# Patient Record
Sex: Male | Born: 1945
Health system: Southern US, Community
[De-identification: ages and names within clinical notes are randomized; demographics above are authoritative.]

## PROBLEM LIST (undated history)

## (undated) DIAGNOSIS — M109 Gout, unspecified: Secondary | ICD-10-CM

## (undated) DIAGNOSIS — J45909 Unspecified asthma, uncomplicated: Secondary | ICD-10-CM

## (undated) DIAGNOSIS — E785 Hyperlipidemia, unspecified: Secondary | ICD-10-CM

## (undated) DIAGNOSIS — K219 Gastro-esophageal reflux disease without esophagitis: Secondary | ICD-10-CM

## (undated) DIAGNOSIS — E119 Type 2 diabetes mellitus without complications: Secondary | ICD-10-CM

## (undated) DIAGNOSIS — I10 Essential (primary) hypertension: Secondary | ICD-10-CM

## (undated) HISTORY — DX: Unspecified asthma, uncomplicated: J45.909

## (undated) HISTORY — DX: Gastro-esophageal reflux disease without esophagitis: K21.9

## (undated) HISTORY — DX: Hyperlipidemia, unspecified: E78.5

## (undated) HISTORY — DX: Essential (primary) hypertension: I10

## (undated) HISTORY — DX: Type 2 diabetes mellitus without complications: E11.9

## (undated) HISTORY — DX: Gout, unspecified: M10.9

---

## 2005-04-03 ENCOUNTER — Ambulatory Visit: Payer: Self-pay | Admitting: Internal Medicine

## 2012-12-12 ENCOUNTER — Emergency Department: Payer: Self-pay | Admitting: Emergency Medicine

## 2015-07-11 DIAGNOSIS — Z794 Long term (current) use of insulin: Secondary | ICD-10-CM | POA: Diagnosis not present

## 2015-07-11 DIAGNOSIS — E782 Mixed hyperlipidemia: Secondary | ICD-10-CM | POA: Diagnosis not present

## 2015-07-11 DIAGNOSIS — I10 Essential (primary) hypertension: Secondary | ICD-10-CM | POA: Diagnosis not present

## 2015-07-11 DIAGNOSIS — E119 Type 2 diabetes mellitus without complications: Secondary | ICD-10-CM | POA: Diagnosis not present

## 2015-07-18 DIAGNOSIS — I10 Essential (primary) hypertension: Secondary | ICD-10-CM | POA: Diagnosis not present

## 2015-07-18 DIAGNOSIS — E782 Mixed hyperlipidemia: Secondary | ICD-10-CM | POA: Diagnosis not present

## 2015-07-18 DIAGNOSIS — Z794 Long term (current) use of insulin: Secondary | ICD-10-CM | POA: Diagnosis not present

## 2015-07-18 DIAGNOSIS — J45909 Unspecified asthma, uncomplicated: Secondary | ICD-10-CM | POA: Diagnosis not present

## 2015-07-18 DIAGNOSIS — E119 Type 2 diabetes mellitus without complications: Secondary | ICD-10-CM | POA: Diagnosis not present

## 2015-07-18 DIAGNOSIS — K219 Gastro-esophageal reflux disease without esophagitis: Secondary | ICD-10-CM | POA: Diagnosis not present

## 2015-10-01 DIAGNOSIS — J45909 Unspecified asthma, uncomplicated: Secondary | ICD-10-CM | POA: Diagnosis not present

## 2015-10-01 DIAGNOSIS — J310 Chronic rhinitis: Secondary | ICD-10-CM | POA: Diagnosis not present

## 2015-10-01 DIAGNOSIS — R0602 Shortness of breath: Secondary | ICD-10-CM | POA: Diagnosis not present

## 2015-10-01 DIAGNOSIS — K219 Gastro-esophageal reflux disease without esophagitis: Secondary | ICD-10-CM | POA: Diagnosis not present

## 2015-10-15 DIAGNOSIS — E119 Type 2 diabetes mellitus without complications: Secondary | ICD-10-CM | POA: Diagnosis not present

## 2015-10-15 DIAGNOSIS — Z794 Long term (current) use of insulin: Secondary | ICD-10-CM | POA: Diagnosis not present

## 2015-10-15 DIAGNOSIS — E782 Mixed hyperlipidemia: Secondary | ICD-10-CM | POA: Diagnosis not present

## 2015-10-15 DIAGNOSIS — J45909 Unspecified asthma, uncomplicated: Secondary | ICD-10-CM | POA: Diagnosis not present

## 2015-10-15 DIAGNOSIS — I10 Essential (primary) hypertension: Secondary | ICD-10-CM | POA: Diagnosis not present

## 2015-10-22 DIAGNOSIS — E119 Type 2 diabetes mellitus without complications: Secondary | ICD-10-CM | POA: Diagnosis not present

## 2015-10-22 DIAGNOSIS — Z794 Long term (current) use of insulin: Secondary | ICD-10-CM | POA: Diagnosis not present

## 2015-10-22 DIAGNOSIS — I10 Essential (primary) hypertension: Secondary | ICD-10-CM | POA: Diagnosis not present

## 2015-10-22 DIAGNOSIS — J45909 Unspecified asthma, uncomplicated: Secondary | ICD-10-CM | POA: Diagnosis not present

## 2015-10-22 DIAGNOSIS — E782 Mixed hyperlipidemia: Secondary | ICD-10-CM | POA: Diagnosis not present

## 2015-10-24 DIAGNOSIS — J45909 Unspecified asthma, uncomplicated: Secondary | ICD-10-CM | POA: Diagnosis not present

## 2015-10-24 DIAGNOSIS — J310 Chronic rhinitis: Secondary | ICD-10-CM | POA: Diagnosis not present

## 2015-10-24 DIAGNOSIS — K219 Gastro-esophageal reflux disease without esophagitis: Secondary | ICD-10-CM | POA: Diagnosis not present

## 2015-11-21 DIAGNOSIS — J309 Allergic rhinitis, unspecified: Secondary | ICD-10-CM | POA: Diagnosis not present

## 2015-11-21 DIAGNOSIS — R05 Cough: Secondary | ICD-10-CM | POA: Diagnosis not present

## 2015-11-21 DIAGNOSIS — J45909 Unspecified asthma, uncomplicated: Secondary | ICD-10-CM | POA: Diagnosis not present

## 2016-02-20 DIAGNOSIS — R05 Cough: Secondary | ICD-10-CM | POA: Diagnosis not present

## 2016-02-20 DIAGNOSIS — J4520 Mild intermittent asthma, uncomplicated: Secondary | ICD-10-CM | POA: Diagnosis not present

## 2016-02-20 DIAGNOSIS — Z23 Encounter for immunization: Secondary | ICD-10-CM | POA: Diagnosis not present

## 2016-04-13 DIAGNOSIS — E119 Type 2 diabetes mellitus without complications: Secondary | ICD-10-CM | POA: Diagnosis not present

## 2016-04-15 DIAGNOSIS — Z0000 Encounter for general adult medical examination without abnormal findings: Secondary | ICD-10-CM | POA: Diagnosis not present

## 2016-04-15 DIAGNOSIS — E782 Mixed hyperlipidemia: Secondary | ICD-10-CM | POA: Diagnosis not present

## 2016-04-15 DIAGNOSIS — Z125 Encounter for screening for malignant neoplasm of prostate: Secondary | ICD-10-CM | POA: Diagnosis not present

## 2016-04-15 DIAGNOSIS — I10 Essential (primary) hypertension: Secondary | ICD-10-CM | POA: Diagnosis not present

## 2016-04-15 DIAGNOSIS — E119 Type 2 diabetes mellitus without complications: Secondary | ICD-10-CM | POA: Diagnosis not present

## 2016-04-15 DIAGNOSIS — Z794 Long term (current) use of insulin: Secondary | ICD-10-CM | POA: Diagnosis not present

## 2016-04-27 DIAGNOSIS — E782 Mixed hyperlipidemia: Secondary | ICD-10-CM | POA: Diagnosis not present

## 2016-04-27 DIAGNOSIS — Z0000 Encounter for general adult medical examination without abnormal findings: Secondary | ICD-10-CM | POA: Diagnosis not present

## 2016-04-27 DIAGNOSIS — Z23 Encounter for immunization: Secondary | ICD-10-CM | POA: Diagnosis not present

## 2016-04-27 DIAGNOSIS — R972 Elevated prostate specific antigen [PSA]: Secondary | ICD-10-CM | POA: Diagnosis not present

## 2016-04-27 DIAGNOSIS — E119 Type 2 diabetes mellitus without complications: Secondary | ICD-10-CM | POA: Diagnosis not present

## 2016-04-27 DIAGNOSIS — Z794 Long term (current) use of insulin: Secondary | ICD-10-CM | POA: Diagnosis not present

## 2016-04-27 DIAGNOSIS — I10 Essential (primary) hypertension: Secondary | ICD-10-CM | POA: Diagnosis not present

## 2016-06-29 DIAGNOSIS — Z0000 Encounter for general adult medical examination without abnormal findings: Secondary | ICD-10-CM | POA: Diagnosis not present

## 2016-08-20 DIAGNOSIS — J4530 Mild persistent asthma, uncomplicated: Secondary | ICD-10-CM | POA: Diagnosis not present

## 2016-10-20 DIAGNOSIS — Z125 Encounter for screening for malignant neoplasm of prostate: Secondary | ICD-10-CM | POA: Diagnosis not present

## 2016-10-20 DIAGNOSIS — E119 Type 2 diabetes mellitus without complications: Secondary | ICD-10-CM | POA: Diagnosis not present

## 2016-10-20 DIAGNOSIS — E782 Mixed hyperlipidemia: Secondary | ICD-10-CM | POA: Diagnosis not present

## 2016-10-20 DIAGNOSIS — I10 Essential (primary) hypertension: Secondary | ICD-10-CM | POA: Diagnosis not present

## 2016-10-20 DIAGNOSIS — Z794 Long term (current) use of insulin: Secondary | ICD-10-CM | POA: Diagnosis not present

## 2016-10-27 DIAGNOSIS — Z794 Long term (current) use of insulin: Secondary | ICD-10-CM | POA: Diagnosis not present

## 2016-10-27 DIAGNOSIS — I10 Essential (primary) hypertension: Secondary | ICD-10-CM | POA: Diagnosis not present

## 2016-10-27 DIAGNOSIS — E119 Type 2 diabetes mellitus without complications: Secondary | ICD-10-CM | POA: Diagnosis not present

## 2016-10-27 DIAGNOSIS — E782 Mixed hyperlipidemia: Secondary | ICD-10-CM | POA: Diagnosis not present

## 2016-10-27 DIAGNOSIS — R972 Elevated prostate specific antigen [PSA]: Secondary | ICD-10-CM | POA: Diagnosis not present

## 2017-02-18 DIAGNOSIS — R490 Dysphonia: Secondary | ICD-10-CM | POA: Diagnosis not present

## 2017-02-18 DIAGNOSIS — Z23 Encounter for immunization: Secondary | ICD-10-CM | POA: Diagnosis not present

## 2017-02-18 DIAGNOSIS — J310 Chronic rhinitis: Secondary | ICD-10-CM | POA: Diagnosis not present

## 2017-02-18 DIAGNOSIS — J4530 Mild persistent asthma, uncomplicated: Secondary | ICD-10-CM | POA: Diagnosis not present

## 2017-04-14 DIAGNOSIS — E113293 Type 2 diabetes mellitus with mild nonproliferative diabetic retinopathy without macular edema, bilateral: Secondary | ICD-10-CM | POA: Diagnosis not present

## 2017-04-21 DIAGNOSIS — E782 Mixed hyperlipidemia: Secondary | ICD-10-CM | POA: Diagnosis not present

## 2017-04-21 DIAGNOSIS — Z794 Long term (current) use of insulin: Secondary | ICD-10-CM | POA: Diagnosis not present

## 2017-04-21 DIAGNOSIS — I10 Essential (primary) hypertension: Secondary | ICD-10-CM | POA: Diagnosis not present

## 2017-04-21 DIAGNOSIS — Z125 Encounter for screening for malignant neoplasm of prostate: Secondary | ICD-10-CM | POA: Diagnosis not present

## 2017-04-21 DIAGNOSIS — E119 Type 2 diabetes mellitus without complications: Secondary | ICD-10-CM | POA: Diagnosis not present

## 2017-04-28 DIAGNOSIS — Z0000 Encounter for general adult medical examination without abnormal findings: Secondary | ICD-10-CM | POA: Diagnosis not present

## 2017-04-28 DIAGNOSIS — I10 Essential (primary) hypertension: Secondary | ICD-10-CM | POA: Diagnosis not present

## 2017-04-28 DIAGNOSIS — Z794 Long term (current) use of insulin: Secondary | ICD-10-CM | POA: Diagnosis not present

## 2017-04-28 DIAGNOSIS — Z23 Encounter for immunization: Secondary | ICD-10-CM | POA: Diagnosis not present

## 2017-04-28 DIAGNOSIS — R972 Elevated prostate specific antigen [PSA]: Secondary | ICD-10-CM | POA: Diagnosis not present

## 2017-04-28 DIAGNOSIS — E782 Mixed hyperlipidemia: Secondary | ICD-10-CM | POA: Diagnosis not present

## 2017-04-28 DIAGNOSIS — E119 Type 2 diabetes mellitus without complications: Secondary | ICD-10-CM | POA: Diagnosis not present

## 2017-04-28 DIAGNOSIS — E010 Iodine-deficiency related diffuse (endemic) goiter: Secondary | ICD-10-CM | POA: Diagnosis not present

## 2017-04-28 DIAGNOSIS — J3489 Other specified disorders of nose and nasal sinuses: Secondary | ICD-10-CM | POA: Diagnosis not present

## 2017-05-06 DIAGNOSIS — R05 Cough: Secondary | ICD-10-CM | POA: Diagnosis not present

## 2017-05-06 DIAGNOSIS — J340 Abscess, furuncle and carbuncle of nose: Secondary | ICD-10-CM | POA: Diagnosis not present

## 2017-05-06 DIAGNOSIS — R490 Dysphonia: Secondary | ICD-10-CM | POA: Diagnosis not present

## 2017-05-27 ENCOUNTER — Ambulatory Visit: Payer: Self-pay | Admitting: Urology

## 2017-06-09 ENCOUNTER — Encounter: Payer: Self-pay | Admitting: Urology

## 2017-06-09 ENCOUNTER — Ambulatory Visit: Payer: PPO | Admitting: Urology

## 2017-06-09 VITALS — BP 122/68 | HR 62 | Ht 69.0 in | Wt 184.5 lb

## 2017-06-09 DIAGNOSIS — R972 Elevated prostate specific antigen [PSA]: Secondary | ICD-10-CM

## 2017-06-09 LAB — URINALYSIS, COMPLETE
BILIRUBIN UA: NEGATIVE
GLUCOSE, UA: NEGATIVE
Ketones, UA: NEGATIVE
Leukocytes, UA: NEGATIVE
Nitrite, UA: NEGATIVE
PH UA: 6 (ref 5.0–7.5)
Protein, UA: NEGATIVE
RBC UA: NEGATIVE
SPEC GRAV UA: 1.015 (ref 1.005–1.030)
UUROB: 0.2 mg/dL (ref 0.2–1.0)

## 2017-06-09 NOTE — Progress Notes (Signed)
06/09/2017 2:48 PM   Alexander Davidson 13-Mar-1946 [id number]  Referring provider: Dion Body, MD [facility] [place], [place] [address]  Chief Complaint  Patient presents with  . Elevated PSA    HPI: Alexander Davidson is a 72 year old male referred for evaluation of an elevated PSA.  A PSA on 04/21/2017 was elevated at 7.34. His most recent PSA are as follows:  10/2013    4.46 02/2014  3.74 11/17      5.07 10/2016    5.89 04/2017  7.34  He has mild to moderate lower urinary tract symptoms including decreased force and caliber of his urinary stream, intermittent urinary stream and urinary frequency.  His voiding symptoms are not bothersome.  He denies dysuria or gross hematuria.  He denies flank, abdominal, pelvic or scrotal pain.  There is no family history of prostate cancer.   PMH: Past Medical History:  Diagnosis Date  . Asthma   . Diabetes mellitus without complication ([place])   . GERD (gastroesophageal reflux disease)   . Gout   . Hyperlipidemia   . Hypertension     Surgical History: History reviewed. No pertinent surgical history.  Home Medications:  Allergies as of 06/09/2017      Reactions   Other Swelling   Questionable fish allergy- caused throat swelling      Medication List        Accurate as of 06/09/17  2:48 PM. Always use your most recent med list.          INSULIN SYRINGE 1CC/31GX5/16" 31G X 5/16" 1 ML Misc Use 1 Syringe 2 (two) times daily.   losartan-hydrochlorothiazide 100-25 MG tablet Commonly known as:  HYZAAR   metFORMIN 1000 MG tablet Commonly known as:  GLUCOPHAGE Take by mouth.   MULTI-VITAMINS Tabs Take by mouth.   NOVOLIN N 100 UNIT/ML injection Generic drug:  insulin NPH Human Inject into the skin.   omeprazole 10 MG capsule Commonly known as:  PRILOSEC Take by mouth.   ONE TOUCH ULTRA TEST test strip Generic drug:  glucose blood Use 1 each as directed. Check CBG's twice daily and PRN  if symptomatic. Dx: 010.27   ONETOUCH DELICA LANCETS FINE Misc Use 1 Units as directed. Check CBG's twice daily and PRN if symptomatic. Dx: 250.02   simvastatin 20 MG tablet Commonly known as:  ZOCOR Take by mouth.   SYMBICORT 160-4.5 MCG/ACT inhaler Generic drug:  budesonide-formoterol Inhale into the lungs.   URINOZINC PLUS PO Take by mouth.   OSTEO BI-FLEX/5-LOXIN ADVANCED PO Take by mouth.       Allergies:  Allergies  Allergen Reactions  . Other Swelling    Questionable fish allergy- caused throat swelling    Family History: Family History  Problem Relation Age of Onset  . Prostate cancer Neg Hx   . Bladder Cancer Neg Hx   . Kidney cancer Neg Hx     Social History:  reports that  has never smoked. he has never used smokeless tobacco. He reports that he drinks alcohol. He reports that he does not use drugs.  ROS: UROLOGY Frequent Urination?: Yes Hard to postpone urination?: No Burning/pain with urination?: No Get up at night to urinate?: Yes Leakage of urine?: No Urine stream starts and stops?: Yes Trouble starting stream?: No Do you have to strain to urinate?: No Blood in urine?: No Urinary tract infection?: No Sexually transmitted disease?: No Injury to kidneys or bladder?: No Painful intercourse?: No Weak stream?: Yes  Erection problems?: Yes Penile pain?: No  Gastrointestinal Nausea?: No Vomiting?: No Indigestion/heartburn?: No Diarrhea?: No Constipation?: No  Constitutional Fever: No Night sweats?: No Weight loss?: No Fatigue?: No  Skin Skin rash/lesions?: No Itching?: Yes  Eyes Blurred vision?: No Double vision?: No  Ears/Nose/Throat Sore throat?: No Sinus problems?: Yes  Hematologic/Lymphatic Swollen glands?: No Easy bruising?: No  Cardiovascular Leg swelling?: No Chest pain?: No  Respiratory Cough?: Yes Shortness of breath?: No  Endocrine Excessive thirst?: No  Musculoskeletal Back pain?: Yes Joint pain?:  Yes  Neurological Headaches?: No Dizziness?: No  Psychologic Depression?: No Anxiety?: No  Physical Exam: BP 122/68 (BP Location: Right Arm, Patient Position: Sitting, Cuff Size: Normal)   Pulse 62   Ht 5\' 9"  (1.753 m)   Wt 184 lb 8 oz (83.7 kg)   BMI 27.25 kg/m   Constitutional:  Alert and oriented, No acute distress. HEENT: [place] AT, moist mucus membranes.  Trachea midline, no masses. Cardiovascular: No clubbing, cyanosis, or edema. Respiratory: Normal respiratory effort, no increased work of breathing. GI: Abdomen is soft, nontender, nondistended, no abdominal masses GU: No CVA tenderness.  Prostate 40 g with increased firmness left side.  Penis circumcised without lesions, testes descended bilaterally without masses or tenderness. Skin: No rashes, bruises or suspicious lesions. Lymph: No cervical or inguinal adenopathy. Neurologic: Grossly intact, no focal deficits, moving all 4 extremities. Psychiatric: Normal mood and affect.  Laboratory Data:  Urinalysis Dipstick/microscopy negative  Assessment & Plan:    72 year old male with a rising PSA and DRE.  These findings were discussed with Alexander Davidson and his wife.  Urinalysis shows no evidence of infection.  I recommended scheduling  transrectal ultrasound and biopsy of the prostate.  Discussed surveillance however this was not recommended.  Potential complications of prostate biopsy were discussed including bleeding and infection/sepsis.  He wanted to think this over before scheduling.   [name], [facility] [address] [place], [place] [address] [phone]

## 2017-06-10 ENCOUNTER — Encounter: Payer: Self-pay | Admitting: Urology

## 2017-06-10 DIAGNOSIS — R972 Elevated prostate specific antigen [PSA]: Secondary | ICD-10-CM | POA: Insufficient documentation

## 2017-08-19 DIAGNOSIS — J4520 Mild intermittent asthma, uncomplicated: Secondary | ICD-10-CM | POA: Diagnosis not present

## 2017-08-19 DIAGNOSIS — J310 Chronic rhinitis: Secondary | ICD-10-CM | POA: Diagnosis not present

## 2017-08-19 DIAGNOSIS — R0602 Shortness of breath: Secondary | ICD-10-CM | POA: Diagnosis not present

## 2017-08-24 DIAGNOSIS — I10 Essential (primary) hypertension: Secondary | ICD-10-CM | POA: Diagnosis not present

## 2017-08-24 DIAGNOSIS — Z794 Long term (current) use of insulin: Secondary | ICD-10-CM | POA: Diagnosis not present

## 2017-08-24 DIAGNOSIS — E782 Mixed hyperlipidemia: Secondary | ICD-10-CM | POA: Diagnosis not present

## 2017-08-24 DIAGNOSIS — E119 Type 2 diabetes mellitus without complications: Secondary | ICD-10-CM | POA: Diagnosis not present

## 2017-08-26 ENCOUNTER — Telehealth: Payer: Self-pay | Admitting: Urology

## 2017-08-26 DIAGNOSIS — R972 Elevated prostate specific antigen [PSA]: Secondary | ICD-10-CM

## 2017-08-26 NOTE — Telephone Encounter (Signed)
Pt called and wants to know if he can come in to have PSA lab drawn.  If still rising, he will consider a prostate biopsy.  Please advise.

## 2017-08-27 DIAGNOSIS — R972 Elevated prostate specific antigen [PSA]: Secondary | ICD-10-CM | POA: Diagnosis not present

## 2017-08-29 NOTE — Telephone Encounter (Signed)
Okay to have PSA drawn.  Order was entered

## 2017-08-31 DIAGNOSIS — I10 Essential (primary) hypertension: Secondary | ICD-10-CM | POA: Diagnosis not present

## 2017-08-31 DIAGNOSIS — Z794 Long term (current) use of insulin: Secondary | ICD-10-CM | POA: Diagnosis not present

## 2017-08-31 DIAGNOSIS — Z862 Personal history of diseases of the blood and blood-forming organs and certain disorders involving the immune mechanism: Secondary | ICD-10-CM | POA: Diagnosis not present

## 2017-08-31 DIAGNOSIS — E119 Type 2 diabetes mellitus without complications: Secondary | ICD-10-CM | POA: Diagnosis not present

## 2017-08-31 DIAGNOSIS — R972 Elevated prostate specific antigen [PSA]: Secondary | ICD-10-CM | POA: Diagnosis not present

## 2017-08-31 DIAGNOSIS — E782 Mixed hyperlipidemia: Secondary | ICD-10-CM | POA: Diagnosis not present

## 2017-09-16 NOTE — Telephone Encounter (Signed)
Pt had PSA drawn by Dr. Carlean Jews @ [facility].  It was 6.4, so it has decreased.  Should pt still consider prostate biopsy?  Please advise.

## 2017-09-17 NOTE — Telephone Encounter (Signed)
PSA is still elevated above baseline and DRE was abnormal.  Would still recc prostate biopsy

## 2017-09-20 NOTE — Telephone Encounter (Signed)
lmom for pt to call office

## 2017-09-21 NOTE — Telephone Encounter (Signed)
lmom for pt to call office

## 2017-09-22 NOTE — Telephone Encounter (Signed)
Pt wife informed, pt has not made up mind whether he wants to proceed with biopsy will call with questions or to schedule.

## 2017-12-29 DIAGNOSIS — I10 Essential (primary) hypertension: Secondary | ICD-10-CM | POA: Diagnosis not present

## 2017-12-29 DIAGNOSIS — R972 Elevated prostate specific antigen [PSA]: Secondary | ICD-10-CM | POA: Diagnosis not present

## 2017-12-29 DIAGNOSIS — Z862 Personal history of diseases of the blood and blood-forming organs and certain disorders involving the immune mechanism: Secondary | ICD-10-CM | POA: Diagnosis not present

## 2017-12-29 DIAGNOSIS — E782 Mixed hyperlipidemia: Secondary | ICD-10-CM | POA: Diagnosis not present

## 2017-12-29 DIAGNOSIS — Z794 Long term (current) use of insulin: Secondary | ICD-10-CM | POA: Diagnosis not present

## 2017-12-29 DIAGNOSIS — E119 Type 2 diabetes mellitus without complications: Secondary | ICD-10-CM | POA: Diagnosis not present

## 2018-01-10 DIAGNOSIS — I10 Essential (primary) hypertension: Secondary | ICD-10-CM | POA: Diagnosis not present

## 2018-01-10 DIAGNOSIS — L989 Disorder of the skin and subcutaneous tissue, unspecified: Secondary | ICD-10-CM | POA: Diagnosis not present

## 2018-01-10 DIAGNOSIS — Z794 Long term (current) use of insulin: Secondary | ICD-10-CM | POA: Diagnosis not present

## 2018-01-10 DIAGNOSIS — R972 Elevated prostate specific antigen [PSA]: Secondary | ICD-10-CM | POA: Diagnosis not present

## 2018-01-10 DIAGNOSIS — E782 Mixed hyperlipidemia: Secondary | ICD-10-CM | POA: Diagnosis not present

## 2018-01-10 DIAGNOSIS — E119 Type 2 diabetes mellitus without complications: Secondary | ICD-10-CM | POA: Diagnosis not present

## 2018-01-10 DIAGNOSIS — Z862 Personal history of diseases of the blood and blood-forming organs and certain disorders involving the immune mechanism: Secondary | ICD-10-CM | POA: Diagnosis not present

## 2018-02-17 DIAGNOSIS — J4520 Mild intermittent asthma, uncomplicated: Secondary | ICD-10-CM | POA: Diagnosis not present

## 2018-06-09 DIAGNOSIS — E113291 Type 2 diabetes mellitus with mild nonproliferative diabetic retinopathy without macular edema, right eye: Secondary | ICD-10-CM | POA: Diagnosis not present

## 2018-06-17 DIAGNOSIS — Z1211 Encounter for screening for malignant neoplasm of colon: Secondary | ICD-10-CM | POA: Diagnosis not present

## 2018-07-06 DIAGNOSIS — Z862 Personal history of diseases of the blood and blood-forming organs and certain disorders involving the immune mechanism: Secondary | ICD-10-CM | POA: Diagnosis not present

## 2018-07-06 DIAGNOSIS — E782 Mixed hyperlipidemia: Secondary | ICD-10-CM | POA: Diagnosis not present

## 2018-07-06 DIAGNOSIS — R972 Elevated prostate specific antigen [PSA]: Secondary | ICD-10-CM | POA: Diagnosis not present

## 2018-07-06 DIAGNOSIS — Z794 Long term (current) use of insulin: Secondary | ICD-10-CM | POA: Diagnosis not present

## 2018-07-06 DIAGNOSIS — I10 Essential (primary) hypertension: Secondary | ICD-10-CM | POA: Diagnosis not present

## 2018-07-06 DIAGNOSIS — E119 Type 2 diabetes mellitus without complications: Secondary | ICD-10-CM | POA: Diagnosis not present

## 2018-07-13 DIAGNOSIS — Z23 Encounter for immunization: Secondary | ICD-10-CM | POA: Diagnosis not present

## 2018-07-13 DIAGNOSIS — E782 Mixed hyperlipidemia: Secondary | ICD-10-CM | POA: Diagnosis not present

## 2018-07-13 DIAGNOSIS — Z0000 Encounter for general adult medical examination without abnormal findings: Secondary | ICD-10-CM | POA: Diagnosis not present

## 2018-07-13 DIAGNOSIS — R972 Elevated prostate specific antigen [PSA]: Secondary | ICD-10-CM | POA: Diagnosis not present

## 2018-07-13 DIAGNOSIS — E119 Type 2 diabetes mellitus without complications: Secondary | ICD-10-CM | POA: Diagnosis not present

## 2018-07-13 DIAGNOSIS — I10 Essential (primary) hypertension: Secondary | ICD-10-CM | POA: Diagnosis not present

## 2018-07-13 DIAGNOSIS — Z794 Long term (current) use of insulin: Secondary | ICD-10-CM | POA: Diagnosis not present

## 2018-07-13 DIAGNOSIS — Z862 Personal history of diseases of the blood and blood-forming organs and certain disorders involving the immune mechanism: Secondary | ICD-10-CM | POA: Diagnosis not present

## 2018-07-19 DIAGNOSIS — J310 Chronic rhinitis: Secondary | ICD-10-CM | POA: Diagnosis not present

## 2018-07-19 DIAGNOSIS — J4520 Mild intermittent asthma, uncomplicated: Secondary | ICD-10-CM | POA: Diagnosis not present

## 2018-08-12 DIAGNOSIS — J189 Pneumonia, unspecified organism: Secondary | ICD-10-CM | POA: Diagnosis not present

## 2018-10-26 DIAGNOSIS — E1169 Type 2 diabetes mellitus with other specified complication: Secondary | ICD-10-CM | POA: Diagnosis not present

## 2018-10-26 DIAGNOSIS — Z862 Personal history of diseases of the blood and blood-forming organs and certain disorders involving the immune mechanism: Secondary | ICD-10-CM | POA: Diagnosis not present

## 2018-10-26 DIAGNOSIS — E782 Mixed hyperlipidemia: Secondary | ICD-10-CM | POA: Diagnosis not present

## 2018-10-26 DIAGNOSIS — I10 Essential (primary) hypertension: Secondary | ICD-10-CM | POA: Diagnosis not present

## 2018-10-26 DIAGNOSIS — E785 Hyperlipidemia, unspecified: Secondary | ICD-10-CM | POA: Diagnosis not present

## 2018-10-26 DIAGNOSIS — R972 Elevated prostate specific antigen [PSA]: Secondary | ICD-10-CM | POA: Diagnosis not present

## 2018-11-02 DIAGNOSIS — I10 Essential (primary) hypertension: Secondary | ICD-10-CM | POA: Diagnosis not present

## 2018-11-02 DIAGNOSIS — E785 Hyperlipidemia, unspecified: Secondary | ICD-10-CM | POA: Diagnosis not present

## 2018-11-02 DIAGNOSIS — Z862 Personal history of diseases of the blood and blood-forming organs and certain disorders involving the immune mechanism: Secondary | ICD-10-CM | POA: Diagnosis not present

## 2018-11-02 DIAGNOSIS — E1169 Type 2 diabetes mellitus with other specified complication: Secondary | ICD-10-CM | POA: Diagnosis not present

## 2018-11-02 DIAGNOSIS — E782 Mixed hyperlipidemia: Secondary | ICD-10-CM | POA: Diagnosis not present

## 2018-11-02 DIAGNOSIS — R972 Elevated prostate specific antigen [PSA]: Secondary | ICD-10-CM | POA: Diagnosis not present

## 2019-01-17 DIAGNOSIS — J4520 Mild intermittent asthma, uncomplicated: Secondary | ICD-10-CM | POA: Diagnosis not present

## 2019-01-17 DIAGNOSIS — J310 Chronic rhinitis: Secondary | ICD-10-CM | POA: Diagnosis not present

## 2019-02-28 DIAGNOSIS — I10 Essential (primary) hypertension: Secondary | ICD-10-CM | POA: Diagnosis not present

## 2019-02-28 DIAGNOSIS — E782 Mixed hyperlipidemia: Secondary | ICD-10-CM | POA: Diagnosis not present

## 2019-02-28 DIAGNOSIS — R972 Elevated prostate specific antigen [PSA]: Secondary | ICD-10-CM | POA: Diagnosis not present

## 2019-02-28 DIAGNOSIS — Z862 Personal history of diseases of the blood and blood-forming organs and certain disorders involving the immune mechanism: Secondary | ICD-10-CM | POA: Diagnosis not present

## 2019-02-28 DIAGNOSIS — E785 Hyperlipidemia, unspecified: Secondary | ICD-10-CM | POA: Diagnosis not present

## 2019-02-28 DIAGNOSIS — E1169 Type 2 diabetes mellitus with other specified complication: Secondary | ICD-10-CM | POA: Diagnosis not present

## 2019-03-07 DIAGNOSIS — E785 Hyperlipidemia, unspecified: Secondary | ICD-10-CM | POA: Diagnosis not present

## 2019-03-07 DIAGNOSIS — E782 Mixed hyperlipidemia: Secondary | ICD-10-CM | POA: Diagnosis not present

## 2019-03-07 DIAGNOSIS — Z862 Personal history of diseases of the blood and blood-forming organs and certain disorders involving the immune mechanism: Secondary | ICD-10-CM | POA: Diagnosis not present

## 2019-03-07 DIAGNOSIS — I10 Essential (primary) hypertension: Secondary | ICD-10-CM | POA: Diagnosis not present

## 2019-03-07 DIAGNOSIS — R972 Elevated prostate specific antigen [PSA]: Secondary | ICD-10-CM | POA: Diagnosis not present

## 2019-03-07 DIAGNOSIS — E1169 Type 2 diabetes mellitus with other specified complication: Secondary | ICD-10-CM | POA: Diagnosis not present

## 2019-08-16 DIAGNOSIS — H2513 Age-related nuclear cataract, bilateral: Secondary | ICD-10-CM | POA: Diagnosis not present

## 2019-08-21 DIAGNOSIS — R05 Cough: Secondary | ICD-10-CM | POA: Diagnosis not present

## 2019-08-21 DIAGNOSIS — Z01818 Encounter for other preprocedural examination: Secondary | ICD-10-CM | POA: Diagnosis not present

## 2019-08-21 DIAGNOSIS — J4520 Mild intermittent asthma, uncomplicated: Secondary | ICD-10-CM | POA: Diagnosis not present

## 2019-08-21 DIAGNOSIS — J310 Chronic rhinitis: Secondary | ICD-10-CM | POA: Diagnosis not present

## 2019-08-29 DIAGNOSIS — E1169 Type 2 diabetes mellitus with other specified complication: Secondary | ICD-10-CM | POA: Diagnosis not present

## 2019-08-29 DIAGNOSIS — Z862 Personal history of diseases of the blood and blood-forming organs and certain disorders involving the immune mechanism: Secondary | ICD-10-CM | POA: Diagnosis not present

## 2019-08-29 DIAGNOSIS — E785 Hyperlipidemia, unspecified: Secondary | ICD-10-CM | POA: Diagnosis not present

## 2019-08-29 DIAGNOSIS — I10 Essential (primary) hypertension: Secondary | ICD-10-CM | POA: Diagnosis not present

## 2019-08-29 DIAGNOSIS — E782 Mixed hyperlipidemia: Secondary | ICD-10-CM | POA: Diagnosis not present

## 2019-08-29 DIAGNOSIS — R972 Elevated prostate specific antigen [PSA]: Secondary | ICD-10-CM | POA: Diagnosis not present

## 2019-09-05 DIAGNOSIS — E782 Mixed hyperlipidemia: Secondary | ICD-10-CM | POA: Diagnosis not present

## 2019-09-05 DIAGNOSIS — E785 Hyperlipidemia, unspecified: Secondary | ICD-10-CM | POA: Diagnosis not present

## 2019-09-05 DIAGNOSIS — Z0000 Encounter for general adult medical examination without abnormal findings: Secondary | ICD-10-CM | POA: Diagnosis not present

## 2019-09-05 DIAGNOSIS — E1169 Type 2 diabetes mellitus with other specified complication: Secondary | ICD-10-CM | POA: Diagnosis not present

## 2019-09-05 DIAGNOSIS — I10 Essential (primary) hypertension: Secondary | ICD-10-CM | POA: Diagnosis not present

## 2019-09-05 DIAGNOSIS — N289 Disorder of kidney and ureter, unspecified: Secondary | ICD-10-CM | POA: Diagnosis not present

## 2019-09-05 DIAGNOSIS — Z862 Personal history of diseases of the blood and blood-forming organs and certain disorders involving the immune mechanism: Secondary | ICD-10-CM | POA: Diagnosis not present

## 2019-09-05 DIAGNOSIS — R972 Elevated prostate specific antigen [PSA]: Secondary | ICD-10-CM | POA: Diagnosis not present

## 2019-12-26 DIAGNOSIS — Z862 Personal history of diseases of the blood and blood-forming organs and certain disorders involving the immune mechanism: Secondary | ICD-10-CM | POA: Diagnosis not present

## 2019-12-26 DIAGNOSIS — E1169 Type 2 diabetes mellitus with other specified complication: Secondary | ICD-10-CM | POA: Diagnosis not present

## 2019-12-26 DIAGNOSIS — R972 Elevated prostate specific antigen [PSA]: Secondary | ICD-10-CM | POA: Diagnosis not present

## 2019-12-26 DIAGNOSIS — N289 Disorder of kidney and ureter, unspecified: Secondary | ICD-10-CM | POA: Diagnosis not present

## 2019-12-26 DIAGNOSIS — E782 Mixed hyperlipidemia: Secondary | ICD-10-CM | POA: Diagnosis not present

## 2019-12-26 DIAGNOSIS — E785 Hyperlipidemia, unspecified: Secondary | ICD-10-CM | POA: Diagnosis not present

## 2020-01-02 DIAGNOSIS — E1169 Type 2 diabetes mellitus with other specified complication: Secondary | ICD-10-CM | POA: Diagnosis not present

## 2020-01-02 DIAGNOSIS — E782 Mixed hyperlipidemia: Secondary | ICD-10-CM | POA: Diagnosis not present

## 2020-01-02 DIAGNOSIS — E785 Hyperlipidemia, unspecified: Secondary | ICD-10-CM | POA: Diagnosis not present

## 2020-01-02 DIAGNOSIS — Z862 Personal history of diseases of the blood and blood-forming organs and certain disorders involving the immune mechanism: Secondary | ICD-10-CM | POA: Diagnosis not present

## 2020-01-02 DIAGNOSIS — Z1159 Encounter for screening for other viral diseases: Secondary | ICD-10-CM | POA: Diagnosis not present

## 2020-01-02 DIAGNOSIS — N289 Disorder of kidney and ureter, unspecified: Secondary | ICD-10-CM | POA: Diagnosis not present

## 2020-01-02 DIAGNOSIS — R972 Elevated prostate specific antigen [PSA]: Secondary | ICD-10-CM | POA: Diagnosis not present

## 2020-02-20 DIAGNOSIS — J310 Chronic rhinitis: Secondary | ICD-10-CM | POA: Diagnosis not present

## 2020-02-20 DIAGNOSIS — J4520 Mild intermittent asthma, uncomplicated: Secondary | ICD-10-CM | POA: Diagnosis not present

## 2020-04-26 DIAGNOSIS — E785 Hyperlipidemia, unspecified: Secondary | ICD-10-CM | POA: Diagnosis not present

## 2020-04-26 DIAGNOSIS — E1169 Type 2 diabetes mellitus with other specified complication: Secondary | ICD-10-CM | POA: Diagnosis not present

## 2020-04-26 DIAGNOSIS — Z1159 Encounter for screening for other viral diseases: Secondary | ICD-10-CM | POA: Diagnosis not present

## 2020-04-26 DIAGNOSIS — N289 Disorder of kidney and ureter, unspecified: Secondary | ICD-10-CM | POA: Diagnosis not present

## 2020-04-26 DIAGNOSIS — E782 Mixed hyperlipidemia: Secondary | ICD-10-CM | POA: Diagnosis not present

## 2020-04-26 DIAGNOSIS — R972 Elevated prostate specific antigen [PSA]: Secondary | ICD-10-CM | POA: Diagnosis not present

## 2020-05-03 DIAGNOSIS — E782 Mixed hyperlipidemia: Secondary | ICD-10-CM | POA: Diagnosis not present

## 2020-05-03 DIAGNOSIS — E1169 Type 2 diabetes mellitus with other specified complication: Secondary | ICD-10-CM | POA: Diagnosis not present

## 2020-05-03 DIAGNOSIS — E785 Hyperlipidemia, unspecified: Secondary | ICD-10-CM | POA: Diagnosis not present

## 2020-05-03 DIAGNOSIS — N289 Disorder of kidney and ureter, unspecified: Secondary | ICD-10-CM | POA: Diagnosis not present

## 2020-05-03 DIAGNOSIS — R972 Elevated prostate specific antigen [PSA]: Secondary | ICD-10-CM | POA: Diagnosis not present

## 2020-05-03 DIAGNOSIS — Z9189 Other specified personal risk factors, not elsewhere classified: Secondary | ICD-10-CM | POA: Diagnosis not present

## 2020-05-27 ENCOUNTER — Encounter: Payer: Self-pay | Admitting: Urology

## 2020-05-27 ENCOUNTER — Other Ambulatory Visit: Payer: Self-pay

## 2020-05-27 ENCOUNTER — Ambulatory Visit: Payer: PPO | Admitting: Urology

## 2020-05-27 VITALS — BP 164/75 | HR 66 | Ht 66.0 in | Wt 184.1 lb

## 2020-05-27 DIAGNOSIS — R3989 Other symptoms and signs involving the genitourinary system: Secondary | ICD-10-CM | POA: Diagnosis not present

## 2020-05-27 DIAGNOSIS — R972 Elevated prostate specific antigen [PSA]: Secondary | ICD-10-CM

## 2020-05-27 NOTE — Progress Notes (Signed)
05/27/2020 2:11 PM   Alexander Davidson Oct 21, 1945 [id number]  Referring provider: Marisue Ivan, MD [phone] [address] [facility] [address] [facility] [place],  [place] [address]  Chief Complaint  Patient presents with  . Elevated PSA    HPI: 75 y.o. male presents for follow-up of an elevated PSA.   Initially seen January 2019 for a PSA 7.34 that had slowly increased since 2015 from 4.46  Abnormal DRE with firm left prostate  Prostate biopsy was recommended and he requested to have his PSA repeated which was 6.4  Biopsy was still recommended due to the likelihood of prostate cancer based on his PSA elevation and abnormal DRE and he ultimately decided not to pursue  Recent PSA by his PCP last month was 9.27 and follow-up urology visit was recommended  Stable lower urinary tract symptoms including frequency, urgency and decreased stream  Does complain of low back pain  Denies gross hematuria   PMH: Past Medical History:  Diagnosis Date  . Asthma   . Diabetes mellitus without complication ([facility])   . GERD (gastroesophageal reflux disease)   . Gout   . Hyperlipidemia   . Hypertension     Surgical History: No past surgical history on file.  Home Medications:  Allergies as of 05/27/2020      Reactions   Other Swelling   Questionable fish allergy- caused throat swelling      Medication List       Accurate as of May 27, 2020  2:11 PM. If you have any questions, ask your nurse or doctor.        budesonide-formoterol 160-4.5 MCG/ACT inhaler Commonly known as: SYMBICORT Inhale into the lungs.   diphenhydrAMINE 25 mg capsule Commonly known as: BENADRYL Take by mouth.   gentamicin ointment 0.1 % Commonly known as: GARAMYCIN Apply topically.   glucose blood test strip Use 1 each as directed. Check CBG's twice daily and PRN if symptomatic. Dx: 250.02   hydrochlorothiazide 25 MG tablet Commonly known as: HYDRODIURIL Take 25 mg by mouth daily.   insulin  NPH Human 100 UNIT/ML injection Commonly known as: NOVOLIN N Inject into the skin.   INSULIN SYRINGE 1CC/31GX5/16" 31G X 5/16" 1 ML Misc Use 1 Syringe 2 (two) times daily.   losartan 100 MG tablet Commonly known as: COZAAR Take 100 mg by mouth daily.   losartan-hydrochlorothiazide 100-25 MG tablet Commonly known as: HYZAAR   metFORMIN 1000 MG tablet Commonly known as: GLUCOPHAGE Take by mouth.   Multi-Vitamins Tabs Take by mouth.   omeprazole 10 MG capsule Commonly known as: PRILOSEC Take by mouth.   omeprazole 20 MG tablet Commonly known as: PRILOSEC OTC 0.5 tablets daily.   OneTouch Delica Lancets Fine Misc Use 1 Units as directed. Check CBG's twice daily and PRN if symptomatic. Dx: 250.02   Saw Palmetto 450 MG Caps Take 2 capsules by mouth daily.   simvastatin 20 MG tablet Commonly known as: ZOCOR Take by mouth.   URINOZINC PLUS PO Take by mouth.   OSTEO BI-FLEX/5-LOXIN ADVANCED PO Take by mouth.       Allergies:  Allergies  Allergen Reactions  . Other Swelling    Questionable fish allergy- caused throat swelling    Family History: Family History  Problem Relation Age of Onset  . Prostate cancer Neg Hx   . Bladder Cancer Neg Hx   . Kidney cancer Neg Hx     Social History:  reports that he has never smoked. He has never used smokeless  tobacco. He reports current alcohol use. He reports that he does not use drugs.   Physical Exam: BP (!) 164/75 (BP Location: Left Arm, Patient Position: Sitting, Cuff Size: Large)   Pulse 66   Ht 5\' 6"  (1.676 m)   Wt 184 lb 1.6 oz (83.5 kg)   BMI 29.71 kg/m   Constitutional:  Alert and oriented, No acute distress. HEENT: [place] AT, moist mucus membranes.  Trachea midline, no masses. Cardiovascular: No clubbing, cyanosis, or edema. Respiratory: Normal respiratory effort, no increased work of breathing. GI: Abdomen is soft, nontender, nondistended, no abdominal masses GU: Prostate 45 g with bilateral firmness L  >R Neurologic: Grossly intact, no focal deficits, moving all 4 extremities. Psychiatric: Normal mood and affect.   Assessment & Plan:    75 y.o. male with a rising PSA and abnormal DRE.  Findings were discussed in detail and that they are very suspicious for prostate cancer.  He wanted to know why he could not be treated for prostate cancer without having a biopsy and we discussed that treatment is very on grade of cancer and staging  I went over the prostate biopsy in detail and offered to schedule an same-day surgery under sedation if he was concerned about the discomfort  He indicated he wanted to think this over and will call back with his decision  I spent 30 total minutes on the day of the encounter including pre-visit review of the medical record, face-to-face time with the patient, and post visit ordering of labs/imaging/tests.   Riki Altes, MD  [facility] [place] [facility] [address], [address] [place], [place] [address] [phone]

## 2020-05-28 ENCOUNTER — Encounter: Payer: Self-pay | Admitting: Urology

## 2020-05-28 DIAGNOSIS — R3989 Other symptoms and signs involving the genitourinary system: Secondary | ICD-10-CM | POA: Insufficient documentation

## 2020-07-15 DIAGNOSIS — M545 Low back pain, unspecified: Secondary | ICD-10-CM | POA: Diagnosis not present

## 2020-07-15 DIAGNOSIS — R300 Dysuria: Secondary | ICD-10-CM | POA: Diagnosis not present

## 2020-07-15 DIAGNOSIS — R351 Nocturia: Secondary | ICD-10-CM | POA: Diagnosis not present

## 2020-07-15 DIAGNOSIS — N3090 Cystitis, unspecified without hematuria: Secondary | ICD-10-CM | POA: Diagnosis not present

## 2020-07-21 ENCOUNTER — Telehealth: Payer: Self-pay | Admitting: Urology

## 2020-07-21 NOTE — Telephone Encounter (Signed)
At office visit January 2022 prostate biopsy was recommended due to elevated PSA and abnormal prostate exam.  Indicated he wanted to think this over.  Please see if he has any questions

## 2020-07-22 DIAGNOSIS — M545 Low back pain, unspecified: Secondary | ICD-10-CM | POA: Diagnosis not present

## 2020-07-22 DIAGNOSIS — R0600 Dyspnea, unspecified: Secondary | ICD-10-CM | POA: Diagnosis not present

## 2020-07-22 DIAGNOSIS — G8929 Other chronic pain: Secondary | ICD-10-CM | POA: Diagnosis not present

## 2020-07-22 DIAGNOSIS — R972 Elevated prostate specific antigen [PSA]: Secondary | ICD-10-CM | POA: Diagnosis not present

## 2020-07-22 DIAGNOSIS — J4520 Mild intermittent asthma, uncomplicated: Secondary | ICD-10-CM | POA: Diagnosis not present

## 2020-07-25 ENCOUNTER — Encounter: Payer: Self-pay | Admitting: *Deleted

## 2020-07-25 NOTE — Telephone Encounter (Signed)
Left message on sent my chart message

## 2020-08-19 DIAGNOSIS — E119 Type 2 diabetes mellitus without complications: Secondary | ICD-10-CM | POA: Diagnosis not present

## 2020-09-11 DIAGNOSIS — E785 Hyperlipidemia, unspecified: Secondary | ICD-10-CM | POA: Diagnosis not present

## 2020-09-11 DIAGNOSIS — E1169 Type 2 diabetes mellitus with other specified complication: Secondary | ICD-10-CM | POA: Diagnosis not present

## 2020-09-11 DIAGNOSIS — E782 Mixed hyperlipidemia: Secondary | ICD-10-CM | POA: Diagnosis not present

## 2020-09-18 DIAGNOSIS — E785 Hyperlipidemia, unspecified: Secondary | ICD-10-CM | POA: Diagnosis not present

## 2020-09-18 DIAGNOSIS — E782 Mixed hyperlipidemia: Secondary | ICD-10-CM | POA: Diagnosis not present

## 2020-09-18 DIAGNOSIS — I10 Essential (primary) hypertension: Secondary | ICD-10-CM | POA: Diagnosis not present

## 2020-09-18 DIAGNOSIS — Z862 Personal history of diseases of the blood and blood-forming organs and certain disorders involving the immune mechanism: Secondary | ICD-10-CM | POA: Diagnosis not present

## 2020-09-18 DIAGNOSIS — E1169 Type 2 diabetes mellitus with other specified complication: Secondary | ICD-10-CM | POA: Diagnosis not present

## 2020-09-18 DIAGNOSIS — Z0000 Encounter for general adult medical examination without abnormal findings: Secondary | ICD-10-CM | POA: Diagnosis not present

## 2020-09-18 DIAGNOSIS — N289 Disorder of kidney and ureter, unspecified: Secondary | ICD-10-CM | POA: Diagnosis not present

## 2021-01-16 DIAGNOSIS — J310 Chronic rhinitis: Secondary | ICD-10-CM | POA: Diagnosis not present

## 2021-01-16 DIAGNOSIS — J4520 Mild intermittent asthma, uncomplicated: Secondary | ICD-10-CM | POA: Diagnosis not present

## 2021-03-14 DIAGNOSIS — E785 Hyperlipidemia, unspecified: Secondary | ICD-10-CM | POA: Diagnosis not present

## 2021-03-14 DIAGNOSIS — N289 Disorder of kidney and ureter, unspecified: Secondary | ICD-10-CM | POA: Diagnosis not present

## 2021-03-14 DIAGNOSIS — Z862 Personal history of diseases of the blood and blood-forming organs and certain disorders involving the immune mechanism: Secondary | ICD-10-CM | POA: Diagnosis not present

## 2021-03-14 DIAGNOSIS — Z125 Encounter for screening for malignant neoplasm of prostate: Secondary | ICD-10-CM | POA: Diagnosis not present

## 2021-03-14 DIAGNOSIS — E782 Mixed hyperlipidemia: Secondary | ICD-10-CM | POA: Diagnosis not present

## 2021-03-14 DIAGNOSIS — E1169 Type 2 diabetes mellitus with other specified complication: Secondary | ICD-10-CM | POA: Diagnosis not present

## 2021-03-14 DIAGNOSIS — I10 Essential (primary) hypertension: Secondary | ICD-10-CM | POA: Diagnosis not present

## 2021-03-21 DIAGNOSIS — E785 Hyperlipidemia, unspecified: Secondary | ICD-10-CM | POA: Diagnosis not present

## 2021-03-21 DIAGNOSIS — N289 Disorder of kidney and ureter, unspecified: Secondary | ICD-10-CM | POA: Diagnosis not present

## 2021-03-21 DIAGNOSIS — Z862 Personal history of diseases of the blood and blood-forming organs and certain disorders involving the immune mechanism: Secondary | ICD-10-CM | POA: Diagnosis not present

## 2021-03-21 DIAGNOSIS — I10 Essential (primary) hypertension: Secondary | ICD-10-CM | POA: Diagnosis not present

## 2021-03-21 DIAGNOSIS — N1831 Chronic kidney disease, stage 3a: Secondary | ICD-10-CM | POA: Diagnosis not present

## 2021-03-21 DIAGNOSIS — E782 Mixed hyperlipidemia: Secondary | ICD-10-CM | POA: Diagnosis not present

## 2021-03-21 DIAGNOSIS — E1169 Type 2 diabetes mellitus with other specified complication: Secondary | ICD-10-CM | POA: Diagnosis not present

## 2021-07-16 DIAGNOSIS — E782 Mixed hyperlipidemia: Secondary | ICD-10-CM | POA: Diagnosis not present

## 2021-07-16 DIAGNOSIS — Z862 Personal history of diseases of the blood and blood-forming organs and certain disorders involving the immune mechanism: Secondary | ICD-10-CM | POA: Diagnosis not present

## 2021-07-16 DIAGNOSIS — E785 Hyperlipidemia, unspecified: Secondary | ICD-10-CM | POA: Diagnosis not present

## 2021-07-16 DIAGNOSIS — I10 Essential (primary) hypertension: Secondary | ICD-10-CM | POA: Diagnosis not present

## 2021-07-16 DIAGNOSIS — E1169 Type 2 diabetes mellitus with other specified complication: Secondary | ICD-10-CM | POA: Diagnosis not present

## 2021-07-23 DIAGNOSIS — N289 Disorder of kidney and ureter, unspecified: Secondary | ICD-10-CM | POA: Diagnosis not present

## 2021-07-23 DIAGNOSIS — Z862 Personal history of diseases of the blood and blood-forming organs and certain disorders involving the immune mechanism: Secondary | ICD-10-CM | POA: Diagnosis not present

## 2021-07-23 DIAGNOSIS — E785 Hyperlipidemia, unspecified: Secondary | ICD-10-CM | POA: Diagnosis not present

## 2021-07-23 DIAGNOSIS — E1169 Type 2 diabetes mellitus with other specified complication: Secondary | ICD-10-CM | POA: Diagnosis not present

## 2021-07-23 DIAGNOSIS — R972 Elevated prostate specific antigen [PSA]: Secondary | ICD-10-CM | POA: Diagnosis not present

## 2021-07-23 DIAGNOSIS — N1831 Chronic kidney disease, stage 3a: Secondary | ICD-10-CM | POA: Diagnosis not present

## 2021-07-23 DIAGNOSIS — E782 Mixed hyperlipidemia: Secondary | ICD-10-CM | POA: Diagnosis not present

## 2021-07-23 DIAGNOSIS — I10 Essential (primary) hypertension: Secondary | ICD-10-CM | POA: Diagnosis not present

## 2021-08-04 ENCOUNTER — Encounter: Payer: Self-pay | Admitting: *Deleted

## 2021-08-19 DIAGNOSIS — E119 Type 2 diabetes mellitus without complications: Secondary | ICD-10-CM | POA: Diagnosis not present

## 2021-09-01 ENCOUNTER — Ambulatory Visit: Payer: PPO | Admitting: Urology

## 2021-09-01 ENCOUNTER — Encounter: Payer: Self-pay | Admitting: Urology

## 2021-09-01 VITALS — BP 173/80 | HR 63 | Ht 66.0 in | Wt 180.0 lb

## 2021-09-01 DIAGNOSIS — R972 Elevated prostate specific antigen [PSA]: Secondary | ICD-10-CM

## 2021-09-01 DIAGNOSIS — R3989 Other symptoms and signs involving the genitourinary system: Secondary | ICD-10-CM

## 2021-09-01 NOTE — Progress Notes (Signed)
? ?09/01/2021 ?1:09 PM  ? ?Alexander Davidson ?09-Nov-1945 ?[id number] ? ?Referring provider: Dion Body, MD ?[place] ?[facility] ?[place],  [place] [address] ? ?Chief Complaint  ?Patient presents with  ? Elevated PSA  ? ? ?HPI: ?76 y.o. male for follow-up of an elevated PSA.  He was accompanied by his wife. ? ?Initially seen January 2019 and prostate biopsy recommended secondary to rising PSA and abnormal DRE ?He has declined schedule biopsy ?Last seen January 2022 and declined biopsy ?PSA October 2022 was 9.40 and 10.21 July 2021 ?He presents today to discuss prostate biopsy ? ? ?PMH: ?Past Medical History:  ?Diagnosis Date  ? Asthma   ? Diabetes mellitus without complication ([place])   ? GERD (gastroesophageal reflux disease)   ? Gout   ? Hyperlipidemia   ? Hypertension   ? ? ?Surgical History: ?History reviewed. No pertinent surgical history. ? ?Home Medications:  ?Allergies as of 09/01/2021   ? ?   Reactions  ? Other Swelling  ? Questionable fish allergy- caused throat swelling  ? ?  ? ?  ?Medication List  ?  ? ?  ? Accurate as of September 01, 2021  1:09 PM. If you have any questions, ask your nurse or doctor.  ?  ?  ? ?  ? ?STOP taking these medications   ? ?diphenhydrAMINE 25 mg capsule ?Commonly known as: BENADRYL ?Stopped by: [name], MD ?  ? ?  ? ?TAKE these medications   ? ?budesonide-formoterol 160-4.5 MCG/ACT inhaler ?Commonly known as: SYMBICORT ?Inhale into the lungs. ?  ?gentamicin ointment 0.1 % ?Commonly known as: GARAMYCIN ?Apply topically. ?  ?glucose blood test strip ?Use 1 each as directed. Check CBG's twice daily and PRN if symptomatic. Dx: 250.02 ?  ?hydrochlorothiazide 25 MG tablet ?Commonly known as: HYDRODIURIL ?Take 25 mg by mouth daily. ?  ?insulin NPH Human 100 UNIT/ML injection ?Commonly known as: NOVOLIN N ?Inject into the skin. ?  ?INSULIN SYRINGE 1CC/31GX5/16" 31G X 5/16" 1 ML Misc ?Use 1 Syringe 2 (two) times daily. ?  ?losartan 100 MG tablet ?Commonly known  as: COZAAR ?Take 100 mg by mouth daily. ?  ?losartan-hydrochlorothiazide 100-25 MG tablet ?Commonly known as: HYZAAR ?  ?metFORMIN 1000 MG tablet ?Commonly known as: GLUCOPHAGE ?Take by mouth. ?  ?Multi-Vitamins Tabs ?Take by mouth. ?  ?omeprazole 10 MG capsule ?Commonly known as: PRILOSEC ?Take by mouth. ?  ?omeprazole 20 MG tablet ?Commonly known as: PRILOSEC OTC ?0.5 tablets daily. ?  ?OneTouch Delica Lancets Fine Misc ?Use 1 Units as directed. Check CBG's twice daily and PRN if symptomatic. Dx: 250.02 ?  ?Saw Palmetto 450 MG Caps ?Take 2 capsules by mouth daily. ?  ?simvastatin 20 MG tablet ?Commonly known as: ZOCOR ?Take by mouth. ?  ?URINOZINC PLUS PO ?Take by mouth. ?  ?OSTEO BI-FLEX/5-LOXIN ADVANCED PO ?Take by mouth. ?  ? ?  ? ? ?Allergies:  ?Allergies  ?Allergen Reactions  ? Other Swelling  ?  Questionable fish allergy- caused throat swelling  ? ? ?Family History: ?Family History  ?Problem Relation Age of Onset  ? Prostate cancer Neg Hx   ? Bladder Cancer Neg Hx   ? Kidney cancer Neg Hx   ? ? ?Social History:  reports that he has never smoked. He has never used smokeless tobacco. He reports current alcohol use. He reports that he does not use drugs. ? ? ?Physical Exam: ?BP (!) 173/80   Pulse 63   Ht '5\' 6"'$  (  1.676 m)   Wt 180 lb (81.6 kg)   BMI 29.05 kg/m?   ?Constitutional:  Alert and oriented, No acute distress. ?HEENT: [place] AT, moist mucus membranes.  Trachea midline, no masses. ?Cardiovascular: No clubbing, cyanosis, or edema. ?Respiratory: Normal respiratory effort, no increased work of breathing. ?Psychiatric: Normal mood and affect. ? ? ?Assessment & Plan:   ? ?1.  Elevated PSA ?With his rising PSA he is ready to schedule a biopsy ?He inquired if there were any other ways to obtain prostate biopsy samples other than going through the rectum. ?We did discuss transperineal prostate biopsy however we are not equipped to perform and he would need referral.  He was informed [facility] is the closest facility  doing transperineal biopsies ?He was trying to decide if he wanted to have the biopsy performed in same-day surgery under sedation or in the office.  The pros and cons of each procedure were discussed ?He was undecided at this point and we will have the office contact him next week regarding his decision. ?Biopsy complications were discussed including bleeding, infection/sepsis ? ?I spent 30 total minutes on the day of the encounter including pre-visit review of the medical record, face-to-face time with the patient, and post visit ordering of labs/imaging/tests. ? ? ?[name], MD ? ?[facility] ?[address], [address] ?[place], [place] [address] ?[phone] ? ?

## 2021-09-09 ENCOUNTER — Telehealth: Payer: Self-pay | Admitting: Urology

## 2021-09-09 NOTE — Telephone Encounter (Signed)
Patient is ready to schedule his biopsy.  ?

## 2021-09-09 NOTE — Telephone Encounter (Signed)
Left message on voice mail. Not sure which way he wants to go with a biopsy . (Read office note per stoioff.)  ?

## 2021-09-29 ENCOUNTER — Ambulatory Visit (INDEPENDENT_AMBULATORY_CARE_PROVIDER_SITE_OTHER): Payer: PPO | Admitting: Urology

## 2021-09-29 ENCOUNTER — Encounter: Payer: Self-pay | Admitting: Urology

## 2021-09-29 VITALS — BP 150/63 | HR 63 | Ht 68.0 in | Wt 175.0 lb

## 2021-09-29 DIAGNOSIS — R972 Elevated prostate specific antigen [PSA]: Secondary | ICD-10-CM

## 2021-09-29 DIAGNOSIS — C61 Malignant neoplasm of prostate: Secondary | ICD-10-CM

## 2021-09-29 MED ORDER — GENTAMICIN SULFATE 40 MG/ML IJ SOLN
80.0000 mg | Freq: Once | INTRAMUSCULAR | Status: AC
  Administered 2021-09-29: 80 mg via INTRAMUSCULAR

## 2021-09-29 MED ORDER — LEVOFLOXACIN 500 MG PO TABS
500.0000 mg | ORAL_TABLET | Freq: Once | ORAL | Status: AC
  Administered 2021-09-29: 500 mg via ORAL

## 2021-09-29 NOTE — Progress Notes (Signed)
Prostate Biopsy Procedure   Informed consent was obtained after discussing risks/benefits of the procedure.  A time out was performed to ensure correct patient identity.  Pre-Procedure: - Last PSA Level: 03/14/2021-9.4; abnormal DRE - Gentamicin given prophylactically - Levaquin 500 mg administered PO -Transrectal Ultrasound performed revealing a 51 gm prostate -No significant hypoechoic or median lobe noted  Procedure: - Prostate block performed using 10 cc 1% lidocaine and biopsies taken from sextant areas, a total of 12 under ultrasound guidance.  Post-Procedure: - Patient tolerated the procedure well - He was counseled to seek immediate medical attention if experiences any severe pain, significant bleeding, or fevers - Return in one week to discuss biopsy results   John Giovanni, MD

## 2021-10-01 LAB — SURGICAL PATHOLOGY

## 2021-10-03 ENCOUNTER — Telehealth: Payer: Self-pay | Admitting: Urology

## 2021-10-03 DIAGNOSIS — C61 Malignant neoplasm of prostate: Secondary | ICD-10-CM

## 2021-10-03 NOTE — Telephone Encounter (Signed)
I contacted Alexander Davidson to discuss his prostate biopsy report.  He had no postbiopsy problems or complaints.  6/6 left cores were positive for adenocarcinoma (5 cores Gleason 4+5 and 1 Gleason 5+4) tissue involvement ranged from 56-100%.  4/6 right cores also showed adenocarcinoma (3 Gleason 4+5 and 1 Gleason 4+4) tissue involvement on that side range from 10-53%  The pathology report was reviewed and we discussed he has high risk disease.  Staging evaluation recommended and order was placed for CT and bone scan.  Will call with results and discuss treatment options based on staging evaluation.  All of his questions were answered and he was instructed to call should he have any additional questions.

## 2021-10-10 ENCOUNTER — Ambulatory Visit: Payer: PPO | Admitting: Urology

## 2021-10-15 ENCOUNTER — Ambulatory Visit
Admission: RE | Admit: 2021-10-15 | Discharge: 2021-10-15 | Disposition: A | Discharge status: Home / Self Care | Payer: PPO | Source: Ambulatory Visit | Attending: Urology | Admitting: Urology

## 2021-10-15 DIAGNOSIS — C61 Malignant neoplasm of prostate: Secondary | ICD-10-CM | POA: Insufficient documentation

## 2021-10-15 DIAGNOSIS — K8020 Calculus of gallbladder without cholecystitis without obstruction: Secondary | ICD-10-CM | POA: Diagnosis not present

## 2021-10-15 DIAGNOSIS — K5730 Diverticulosis of large intestine without perforation or abscess without bleeding: Secondary | ICD-10-CM | POA: Diagnosis not present

## 2021-10-15 LAB — POCT I-STAT CREATININE: Creatinine, Ser: 1.4 mg/dL — ABNORMAL HIGH (ref 0.61–1.24)

## 2021-10-15 MED ORDER — IOHEXOL 300 MG/ML  SOLN
80.0000 mL | Freq: Once | INTRAMUSCULAR | Status: AC | PRN
  Administered 2021-10-15: 80 mL via INTRAVENOUS

## 2021-10-17 ENCOUNTER — Telehealth: Payer: Self-pay | Admitting: *Deleted

## 2021-10-17 NOTE — Telephone Encounter (Signed)
Notified patient as instructed, patient pleased °

## 2021-10-17 NOTE — Telephone Encounter (Signed)
-----   Message from [name], MD sent at 10/17/2021  7:54 AM EDT ----- CT scan showed no evidence of metastatic disease.  Bone scan is scheduled next week and will contact with results once the report is received.

## 2021-10-22 ENCOUNTER — Ambulatory Visit
Admission: RE | Admit: 2021-10-22 | Discharge: 2021-10-22 | Disposition: A | Discharge status: Home / Self Care | Payer: PPO | Source: Ambulatory Visit | Attending: Urology | Admitting: Urology

## 2021-10-22 ENCOUNTER — Encounter
Admission: RE | Admit: 2021-10-22 | Discharge: 2021-10-22 | Disposition: A | Discharge status: Home / Self Care | Payer: PPO | Source: Ambulatory Visit | Attending: Urology | Admitting: Urology

## 2021-10-22 DIAGNOSIS — C61 Malignant neoplasm of prostate: Secondary | ICD-10-CM | POA: Insufficient documentation

## 2021-10-22 DIAGNOSIS — M47812 Spondylosis without myelopathy or radiculopathy, cervical region: Secondary | ICD-10-CM | POA: Diagnosis not present

## 2021-10-22 MED ORDER — TECHNETIUM TC 99M MEDRONATE IV KIT
20.0000 | PACK | Freq: Once | INTRAVENOUS | Status: AC | PRN
  Administered 2021-10-22: 20.42 via INTRAVENOUS

## 2021-10-24 ENCOUNTER — Encounter: Payer: Self-pay | Admitting: Urology

## 2021-10-24 ENCOUNTER — Telehealth: Payer: Self-pay

## 2021-10-24 NOTE — Telephone Encounter (Signed)
Notified via [place].  Recommended radiation oncology referral

## 2021-10-24 NOTE — Telephone Encounter (Signed)
Pt LM on triage line requesting results of recent scan, please advise.

## 2021-10-29 ENCOUNTER — Encounter: Payer: Self-pay | Admitting: Urology

## 2021-10-29 ENCOUNTER — Ambulatory Visit (INDEPENDENT_AMBULATORY_CARE_PROVIDER_SITE_OTHER): Payer: PPO | Admitting: Urology

## 2021-10-29 VITALS — BP 122/64 | HR 65 | Ht 68.0 in | Wt 180.0 lb

## 2021-10-29 DIAGNOSIS — C61 Malignant neoplasm of prostate: Secondary | ICD-10-CM

## 2021-10-30 ENCOUNTER — Encounter: Payer: Self-pay | Admitting: Urology

## 2021-10-30 NOTE — Progress Notes (Signed)
10/29/2021 7:48 AM   Alexander Davidson 08/12/1945 [id number]  Referring provider: Dion Body, MD [facility] [place],  [place] [address]  Chief Complaint  Patient presents with   Prostate Cancer    Urologic history: 1.  Prostate cancer Bx 09/2021, PSA 10.6, firm left prostate Volume 51 g 6/6 left cores positive Gleason 4+5/5+4 (56-100% tissue involvement) 4/6 right cores Gleason 4+5/4+4 (10-53%) No evidence of metastatic disease CT abdomen/pelvis and bone scan   HPI: 76 y.o. male with the above history.  I had recommended radiation oncology referral and he made a follow-up appointment.  He had questions prior to making the referral.  I reviewed his staging evaluation and that there is no evidence of metastatic disease on CT/bone scan He had several questions regarding radiation and I recommend he asked these to radiation oncology We discussed prostatectomy and based on age and comorbidities I felt the risk of surgery would outweigh benefits   PMH: Past Medical History:  Diagnosis Date   Asthma    Diabetes mellitus without complication ([place])    GERD (gastroesophageal reflux disease)    Gout    Hyperlipidemia    Hypertension     Surgical History: History reviewed. No pertinent surgical history.  Home Medications:  Allergies as of 10/29/2021       Reactions   Other Swelling   Questionable fish allergy- caused throat swelling        Medication List        Accurate as of October 29, 2021 11:59 PM. If you have any questions, ask your nurse or doctor.          budesonide-formoterol 160-4.5 MCG/ACT inhaler Commonly known as: SYMBICORT Inhale into the lungs.   gentamicin ointment 0.1 % Commonly known as: GARAMYCIN Apply topically.   glucose blood test strip Use 1 each as directed. Check CBG's twice daily and PRN if symptomatic. Dx: 250.02   hydrochlorothiazide 25 MG tablet Commonly known as: HYDRODIURIL Take 25 mg by  mouth daily.   insulin NPH Human 100 UNIT/ML injection Commonly known as: NOVOLIN N Inject into the skin.   INSULIN SYRINGE 1CC/31GX5/16" 31G X 5/16" 1 ML Misc Use 1 Syringe 2 (two) times daily.   losartan 100 MG tablet Commonly known as: COZAAR Take 100 mg by mouth daily.   losartan-hydrochlorothiazide 100-25 MG tablet Commonly known as: HYZAAR   metFORMIN 1000 MG tablet Commonly known as: GLUCOPHAGE Take by mouth.   Multi-Vitamins Tabs Take by mouth.   omeprazole 10 MG capsule Commonly known as: PRILOSEC Take by mouth.   omeprazole 20 MG tablet Commonly known as: PRILOSEC OTC 0.5 tablets daily.   OneTouch Delica Lancets Fine Misc Use 1 Units as directed. Check CBG's twice daily and PRN if symptomatic. Dx: 250.02   Saw Palmetto 450 MG Caps Take 2 capsules by mouth daily.   simvastatin 20 MG tablet Commonly known as: ZOCOR Take by mouth.   URINOZINC PLUS PO Take by mouth.   OSTEO BI-FLEX/5-LOXIN ADVANCED PO Take by mouth.        Allergies:  Allergies  Allergen Reactions   Other Swelling    Questionable fish allergy- caused throat swelling    Family History: Family History  Problem Relation Age of Onset   Prostate cancer Neg Hx    Bladder Cancer Neg Hx    Kidney cancer Neg Hx     Social History:  reports that he has never smoked. He has never used smokeless tobacco.  He reports current alcohol use. He reports that he does not use drugs.   Physical Exam: BP 122/64   Pulse 65   Ht '5\' 8"'$  (1.727 m)   Wt 180 lb (81.6 kg)   BMI 27.37 kg/m   Constitutional:  Alert and oriented, No acute distress. Psychiatric: Normal mood and affect.   Assessment & Plan:    Clinical T2N0M0 adenocarcinoma prostate (NCCN high risk) He would like to proceed with radiation oncology referral/evaluation Referral ordered   [name], MD  [facility] [address] [place], [place] [address] [phone]

## 2021-11-03 ENCOUNTER — Ambulatory Visit
Admission: RE | Admit: 2021-11-03 | Discharge: 2021-11-03 | Disposition: A | Discharge status: Home / Self Care | Payer: PPO | Source: Ambulatory Visit | Attending: Radiation Oncology | Admitting: Radiation Oncology

## 2021-11-03 ENCOUNTER — Encounter: Payer: Self-pay | Admitting: Radiation Oncology

## 2021-11-03 VITALS — BP 143/58 | HR 58 | Temp 97.9°F | Resp 18 | Ht 68.0 in | Wt 182.0 lb

## 2021-11-03 DIAGNOSIS — I10 Essential (primary) hypertension: Secondary | ICD-10-CM | POA: Insufficient documentation

## 2021-11-03 DIAGNOSIS — Z79899 Other long term (current) drug therapy: Secondary | ICD-10-CM | POA: Diagnosis not present

## 2021-11-03 DIAGNOSIS — Z794 Long term (current) use of insulin: Secondary | ICD-10-CM | POA: Diagnosis not present

## 2021-11-03 DIAGNOSIS — Z7984 Long term (current) use of oral hypoglycemic drugs: Secondary | ICD-10-CM | POA: Diagnosis not present

## 2021-11-03 DIAGNOSIS — E785 Hyperlipidemia, unspecified: Secondary | ICD-10-CM | POA: Insufficient documentation

## 2021-11-03 DIAGNOSIS — C61 Malignant neoplasm of prostate: Secondary | ICD-10-CM | POA: Insufficient documentation

## 2021-11-03 DIAGNOSIS — E119 Type 2 diabetes mellitus without complications: Secondary | ICD-10-CM | POA: Insufficient documentation

## 2021-11-03 DIAGNOSIS — K219 Gastro-esophageal reflux disease without esophagitis: Secondary | ICD-10-CM | POA: Insufficient documentation

## 2021-11-03 DIAGNOSIS — J45909 Unspecified asthma, uncomplicated: Secondary | ICD-10-CM | POA: Insufficient documentation

## 2021-11-03 NOTE — Consult Note (Signed)
NEW PATIENT EVALUATION  Name: Alexander Davidson  MRN: [id number]  Date:   11/03/2021     DOB: [birth year]   This 76 y.o. male patient presents to the clinic for initial evaluation of stage IIIc (cT2 cN0 M0) Gleason 9 (4+5) presenting with a PSA of 10.6.  REFERRING PHYSICIAN: Dion Body, MD  CHIEF COMPLAINT:  Chief Complaint  Patient presents with   Consult    Prostate Cancer    DIAGNOSIS: The encounter diagnosis was Malignant neoplasm of prostate ([place]).   PREVIOUS INVESTIGATIONS:  CT scan and bone scan reviewed Pathology reports reviewed Clinical notes reviewed  HPI: Patient is a 76 year old male who presented with an elevated 10.6.  Had abnormal digital rectal exam showing firmness throughout the prostate gland.  He underwent biopsy for a 51 g prostate showing 10 of 12 cores positive mostly Gleason 9 (4+5).  CT scan of his abdomen pelvis showed no acute abdominal or pelvic findings mild prostate enlargement no pelvic adenopathy no worrisome bone lesions.  Bone scan confirmed no convincing evidence of osseous metastatic disease.  Patient does have nocturia x3 no specific urgency or frequency no bowel issues.  He is having no bone pain.  He is now referred to radiation oncology for consideration of treatment.  PLANNED TREATMENT REGIMEN: Image guided IMRT radiation therapy to both prostate and pelvic nodes  PAST MEDICAL HISTORY:  has a past medical history of Asthma, Diabetes mellitus without complication ([place]), GERD (gastroesophageal reflux disease), Gout, Hyperlipidemia, and Hypertension.    PAST SURGICAL HISTORY: History reviewed. No pertinent surgical history.  FAMILY HISTORY: family history is not on file.  SOCIAL HISTORY:  reports that he has never smoked. He has never used smokeless tobacco. He reports current alcohol use. He reports that he does not use drugs.  ALLERGIES: Other  MEDICATIONS:  Current Outpatient Medications  Medication Sig Dispense Refill    budesonide-formoterol (SYMBICORT) 160-4.5 MCG/ACT inhaler Inhale into the lungs.     gentamicin ointment (GARAMYCIN) 0.1 % Apply topically.     glucose blood test strip Use 1 each as directed. Check CBG's twice daily and PRN if symptomatic. Dx: 250.02     hydrochlorothiazide (HYDRODIURIL) 25 MG tablet Take 25 mg by mouth daily.     insulin NPH Human (NOVOLIN N) 100 UNIT/ML injection Inject into the skin.     Insulin Syringe-Needle U-100 (INSULIN SYRINGE 1CC/31GX5/16") 31G X 5/16" 1 ML MISC Use 1 Syringe 2 (two) times daily.     losartan (COZAAR) 100 MG tablet Take 100 mg by mouth daily.     losartan-hydrochlorothiazide (HYZAAR) 100-25 MG tablet      metFORMIN (GLUCOPHAGE) 1000 MG tablet Take by mouth.     Misc Natural Products (OSTEO BI-FLEX/5-LOXIN ADVANCED PO) Take by mouth.     Misc Natural Products (URINOZINC PLUS PO) Take by mouth.     Multiple Vitamin (MULTI-VITAMINS) TABS Take by mouth.     omeprazole (PRILOSEC OTC) 20 MG tablet 0.5 tablets daily.     omeprazole (PRILOSEC) 10 MG capsule Take by mouth.     ONETOUCH DELICA LANCETS FINE MISC Use 1 Units as directed. Check CBG's twice daily and PRN if symptomatic. Dx: 250.02     Saw Palmetto 450 MG CAPS Take 2 capsules by mouth daily.     simvastatin (ZOCOR) 20 MG tablet Take by mouth.     No current facility-administered medications for this encounter.    ECOG PERFORMANCE STATUS:  0 - Asymptomatic  REVIEW OF SYSTEMS: Patient denies any  weight loss, fatigue, weakness, fever, chills or night sweats. Patient denies any loss of vision, blurred vision. Patient denies any ringing  of the ears or hearing loss. No irregular heartbeat. Patient denies heart murmur or history of fainting. Patient denies any chest pain or pain radiating to her upper extremities. Patient denies any shortness of breath, difficulty breathing at night, cough or hemoptysis. Patient denies any swelling in the lower legs. Patient denies any nausea vomiting, vomiting of  blood, or coffee ground material in the vomitus. Patient denies any stomach pain. Patient states has had normal bowel movements no significant constipation or diarrhea. Patient denies any dysuria, hematuria or significant nocturia. Patient denies any problems walking, swelling in the joints or loss of balance. Patient denies any skin changes, loss of hair or loss of weight. Patient denies any excessive worrying or anxiety or significant depression. Patient denies any problems with insomnia. Patient denies excessive thirst, polyuria, polydipsia. Patient denies any swollen glands, patient denies easy bruising or easy bleeding. Patient denies any recent infections, allergies or URI. Patient "s visual fields have not changed significantly in recent time.   PHYSICAL EXAM: BP (!) 143/58   Pulse (!) 58   Temp 97.9 F (36.6 C)   Resp 18   Ht '5\' 8"'$  (1.727 m)   Wt 182 lb (82.6 kg)   BMI 27.67 kg/m  Well-developed well-nourished patient in NAD. HEENT reveals PERLA, EOMI, discs not visualized.  Oral cavity is clear. No oral mucosal lesions are identified. Neck is clear without evidence of cervical or supraclavicular adenopathy. Lungs are clear to A&P. Cardiac examination is essentially unremarkable with regular rate and rhythm without murmur rub or thrill. Abdomen is benign with no organomegaly or masses noted. Motor sensory and DTR levels are equal and symmetric in the upper and lower extremities. Cranial nerves II through XII are grossly intact. Proprioception is intact. No peripheral adenopathy or edema is identified. No motor or sensory levels are noted. Crude visual fields are within normal range.  LABORATORY DATA: Pathology reports reviewed    RADIOLOGY RESULTS: Bone scan and CT scans reviewed compatible with above-stated findings   IMPRESSION: Stage IIIc adenocarcinoma of the prostate in 76 year old male Gleason 9 (4+5) presenting with a PSA in the 10 range.  PLAN: At this time I have recommended  radiation therapy to with his prostate and pelvic nodes according to the [facility] he has a 94% chance of extracapsular extension as well as a 57% chance of lymph node involvement.  I would treat his prostate 82 [address] Cables and his pelvic lymph nodes to 54 Gray using IMRT dose painting technique.  Risks and benefits of treatment including increased lower urinary tract symptoms diarrhea fatigue alteration of blood count skin reaction all were reviewed in detail with the patient and his wife.  I would also have asked urology to place fiducial markers in his prostate for daily image guided treatment.  I have also asked him to start Eligard treatment.  Patient wife both comprehend her treatment plan well.  We will schedule simulation once we have definitive time for his markers being placed.  I would like to take this opportunity to thank you for allowing me to participate in the care of your patient.Noreene Filbert, MD

## 2021-11-04 ENCOUNTER — Telehealth: Payer: Self-pay

## 2021-11-04 NOTE — Telephone Encounter (Signed)
Benefits investigation form faxed for Eligard, awaiting response.

## 2021-11-12 ENCOUNTER — Ambulatory Visit: Payer: PPO | Admitting: Urology

## 2021-11-12 ENCOUNTER — Encounter: Payer: Self-pay | Admitting: Urology

## 2021-11-12 VITALS — BP 128/65 | HR 64 | Ht 70.0 in | Wt 180.0 lb

## 2021-11-12 DIAGNOSIS — C61 Malignant neoplasm of prostate: Secondary | ICD-10-CM | POA: Diagnosis not present

## 2021-11-12 MED ORDER — LEUPROLIDE ACETATE (6 MONTH) 45 MG ~~LOC~~ KIT
45.0000 mg | PACK | Freq: Once | SUBCUTANEOUS | Status: AC
  Administered 2021-11-12: 45 mg via SUBCUTANEOUS

## 2021-11-12 MED ORDER — GENTAMICIN SULFATE 40 MG/ML IJ SOLN
80.0000 mg | Freq: Once | INTRAMUSCULAR | Status: AC
  Administered 2021-11-12: 80 mg via INTRAMUSCULAR

## 2021-11-12 MED ORDER — LEVOFLOXACIN 500 MG PO TABS
500.0000 mg | ORAL_TABLET | Freq: Once | ORAL | Status: AC
  Administered 2021-11-12: 500 mg via ORAL

## 2021-11-12 NOTE — Progress Notes (Signed)
Eligard SubQ Injection   Due to Prostate Cancer patient is present today for a Eligard Injection.  Medication: Eligard 6 month Dose: 45 mg  Location: left  Lot: [id number] Exp: 03/2023  Patient tolerated well, no complications were noted  Performed by: Elberta Leatherwood CMA  Dr. Donella Stade did not specify duration of ADT and radiation oncology will be contacted. Patient's next follow up not needed at this time. This appointment was scheduled using wheel and given to patient today along with reminder continue on Vitamin D 800-1000iu and Calcium 1000-'1200mg'$  daily while on Androgen Deprivation Therapy.  PA approval dates:

## 2021-11-12 NOTE — Patient Instructions (Signed)
continue on Vitamin D 800-1000iu and Calcium 1000-1200mg daily while on Androgen Deprivation Therapy.  

## 2021-11-12 NOTE — Progress Notes (Signed)
11/12/21  CC: gold fiducial marker placement  HPI: 76 y.o. male with prostate cancer who presents today for placement of fiducial seed markers in anticipation of his upcoming IMRT with Dr. Baruch Gouty.  Prostate Gold fiducial Marker Placement Procedure   Informed consent was obtained after discussing risks/benefits of the procedure.  A time out was performed to ensure correct patient identity.  Pre-Procedure: - Gentamicin given prophylactically - PO Levaquin 500 mg also given today  Procedure: - Lidocaine jelly was administered per rectum - Rectal ultrasound probe was placed without difficulty and the prostate visualized - Prostatic block performed with 10 mL 1% Xylocaine - 3 fiducial gold seed markers placed, one at right base, one at left base, one at apex of prostate gland under transrectal ultrasound guidance  Post-Procedure: - Patient tolerated the procedure well - He was counseled to seek immediate medical attention if experiences any severe pain, significant bleeding, or fevers -ADT was also recommended by Dr. Baruch Gouty.  He received a leuprolide injection today.  We discussed the most common side effect of hot flashes, tiredness and fatigue    John Giovanni, MD

## 2021-11-14 ENCOUNTER — Ambulatory Visit
Admission: RE | Admit: 2021-11-14 | Discharge: 2021-11-14 | Disposition: A | Discharge status: Home / Self Care | Payer: PPO | Source: Ambulatory Visit | Attending: Radiation Oncology | Admitting: Radiation Oncology

## 2021-11-14 DIAGNOSIS — C61 Malignant neoplasm of prostate: Secondary | ICD-10-CM | POA: Diagnosis not present

## 2021-11-19 ENCOUNTER — Ambulatory Visit
Admission: RE | Admit: 2021-11-19 | Discharge: 2021-11-19 | Disposition: A | Discharge status: Home / Self Care | Payer: PPO | Source: Ambulatory Visit | Attending: Radiation Oncology | Admitting: Radiation Oncology

## 2021-11-19 DIAGNOSIS — C61 Malignant neoplasm of prostate: Secondary | ICD-10-CM | POA: Insufficient documentation

## 2021-11-21 ENCOUNTER — Other Ambulatory Visit: Payer: Self-pay | Admitting: *Deleted

## 2021-11-21 DIAGNOSIS — C61 Malignant neoplasm of prostate: Secondary | ICD-10-CM

## 2021-11-26 ENCOUNTER — Ambulatory Visit: Admission: RE | Admit: 2021-11-26 | Payer: PPO | Source: Ambulatory Visit

## 2021-11-27 ENCOUNTER — Other Ambulatory Visit: Payer: Self-pay

## 2021-11-27 DIAGNOSIS — C61 Malignant neoplasm of prostate: Secondary | ICD-10-CM | POA: Diagnosis not present

## 2021-11-27 LAB — RAD ONC ARIA SESSION SUMMARY
Course Elapsed Days: 0
Plan Fractions Treated to Date: 1
Plan Prescribed Dose Per Fraction: 2 Gy
Plan Total Fractions Prescribed: 40
Plan Total Prescribed Dose: 80 Gy
Reference Point Dosage Given to Date: 2 Gy
Reference Point Session Dosage Given: 2 Gy
Session Number: 1

## 2021-11-28 ENCOUNTER — Ambulatory Visit
Admission: RE | Admit: 2021-11-28 | Discharge: 2021-11-28 | Disposition: A | Discharge status: Home / Self Care | Payer: PPO | Source: Ambulatory Visit | Attending: Radiation Oncology | Admitting: Radiation Oncology

## 2021-11-28 ENCOUNTER — Other Ambulatory Visit: Payer: Self-pay

## 2021-11-28 DIAGNOSIS — C61 Malignant neoplasm of prostate: Secondary | ICD-10-CM | POA: Diagnosis not present

## 2021-11-28 LAB — RAD ONC ARIA SESSION SUMMARY
Course Elapsed Days: 1
Plan Fractions Treated to Date: 2
Plan Prescribed Dose Per Fraction: 2 Gy
Plan Total Fractions Prescribed: 40
Plan Total Prescribed Dose: 80 Gy
Reference Point Dosage Given to Date: 4 Gy
Reference Point Session Dosage Given: 2 Gy
Session Number: 2

## 2021-12-01 ENCOUNTER — Ambulatory Visit
Admission: RE | Admit: 2021-12-01 | Discharge: 2021-12-01 | Disposition: A | Discharge status: Home / Self Care | Payer: PPO | Source: Ambulatory Visit | Attending: Radiation Oncology | Admitting: Radiation Oncology

## 2021-12-01 ENCOUNTER — Other Ambulatory Visit: Payer: Self-pay

## 2021-12-01 DIAGNOSIS — C61 Malignant neoplasm of prostate: Secondary | ICD-10-CM | POA: Diagnosis not present

## 2021-12-01 LAB — RAD ONC ARIA SESSION SUMMARY
Course Elapsed Days: 4
Plan Fractions Treated to Date: 3
Plan Prescribed Dose Per Fraction: 2 Gy
Plan Total Fractions Prescribed: 40
Plan Total Prescribed Dose: 80 Gy
Reference Point Dosage Given to Date: 6 Gy
Reference Point Session Dosage Given: 2 Gy
Session Number: 3

## 2021-12-02 ENCOUNTER — Ambulatory Visit
Admission: RE | Admit: 2021-12-02 | Discharge: 2021-12-02 | Disposition: A | Discharge status: Home / Self Care | Payer: PPO | Source: Ambulatory Visit | Attending: Radiation Oncology | Admitting: Radiation Oncology

## 2021-12-02 ENCOUNTER — Other Ambulatory Visit: Payer: Self-pay

## 2021-12-02 DIAGNOSIS — C61 Malignant neoplasm of prostate: Secondary | ICD-10-CM | POA: Diagnosis not present

## 2021-12-02 LAB — RAD ONC ARIA SESSION SUMMARY
Course Elapsed Days: 5
Plan Fractions Treated to Date: 4
Plan Prescribed Dose Per Fraction: 2 Gy
Plan Total Fractions Prescribed: 40
Plan Total Prescribed Dose: 80 Gy
Reference Point Dosage Given to Date: 8 Gy
Reference Point Session Dosage Given: 2 Gy
Session Number: 4

## 2021-12-03 ENCOUNTER — Other Ambulatory Visit: Payer: Self-pay

## 2021-12-03 ENCOUNTER — Ambulatory Visit
Admission: RE | Admit: 2021-12-03 | Discharge: 2021-12-03 | Disposition: A | Discharge status: Home / Self Care | Payer: PPO | Source: Ambulatory Visit | Attending: Radiation Oncology | Admitting: Radiation Oncology

## 2021-12-03 DIAGNOSIS — C61 Malignant neoplasm of prostate: Secondary | ICD-10-CM | POA: Diagnosis not present

## 2021-12-03 LAB — RAD ONC ARIA SESSION SUMMARY
Course Elapsed Days: 6
Plan Fractions Treated to Date: 5
Plan Prescribed Dose Per Fraction: 2 Gy
Plan Total Fractions Prescribed: 40
Plan Total Prescribed Dose: 80 Gy
Reference Point Dosage Given to Date: 10 Gy
Reference Point Session Dosage Given: 2 Gy
Session Number: 5

## 2021-12-04 ENCOUNTER — Other Ambulatory Visit: Payer: Self-pay

## 2021-12-04 ENCOUNTER — Ambulatory Visit
Admission: RE | Admit: 2021-12-04 | Discharge: 2021-12-04 | Disposition: A | Discharge status: Home / Self Care | Payer: PPO | Source: Ambulatory Visit | Attending: Radiation Oncology | Admitting: Radiation Oncology

## 2021-12-04 DIAGNOSIS — C61 Malignant neoplasm of prostate: Secondary | ICD-10-CM | POA: Diagnosis not present

## 2021-12-04 LAB — RAD ONC ARIA SESSION SUMMARY
Course Elapsed Days: 7
Plan Fractions Treated to Date: 6
Plan Prescribed Dose Per Fraction: 2 Gy
Plan Total Fractions Prescribed: 40
Plan Total Prescribed Dose: 80 Gy
Reference Point Dosage Given to Date: 12 Gy
Reference Point Session Dosage Given: 2 Gy
Session Number: 6

## 2021-12-05 ENCOUNTER — Ambulatory Visit
Admission: RE | Admit: 2021-12-05 | Discharge: 2021-12-05 | Disposition: A | Discharge status: Home / Self Care | Payer: PPO | Source: Ambulatory Visit | Attending: Radiation Oncology | Admitting: Radiation Oncology

## 2021-12-05 ENCOUNTER — Other Ambulatory Visit: Payer: Self-pay

## 2021-12-05 DIAGNOSIS — C61 Malignant neoplasm of prostate: Secondary | ICD-10-CM | POA: Diagnosis not present

## 2021-12-05 LAB — RAD ONC ARIA SESSION SUMMARY
Course Elapsed Days: 8
Plan Fractions Treated to Date: 7
Plan Prescribed Dose Per Fraction: 2 Gy
Plan Total Fractions Prescribed: 40
Plan Total Prescribed Dose: 80 Gy
Reference Point Dosage Given to Date: 14 Gy
Reference Point Session Dosage Given: 2 Gy
Session Number: 7

## 2021-12-08 ENCOUNTER — Other Ambulatory Visit: Payer: Self-pay

## 2021-12-08 ENCOUNTER — Ambulatory Visit
Admission: RE | Admit: 2021-12-08 | Discharge: 2021-12-08 | Disposition: A | Discharge status: Home / Self Care | Payer: PPO | Source: Ambulatory Visit | Attending: Radiation Oncology | Admitting: Radiation Oncology

## 2021-12-08 DIAGNOSIS — C61 Malignant neoplasm of prostate: Secondary | ICD-10-CM | POA: Diagnosis not present

## 2021-12-08 LAB — RAD ONC ARIA SESSION SUMMARY
Course Elapsed Days: 11
Plan Fractions Treated to Date: 8
Plan Prescribed Dose Per Fraction: 2 Gy
Plan Total Fractions Prescribed: 40
Plan Total Prescribed Dose: 80 Gy
Reference Point Dosage Given to Date: 16 Gy
Reference Point Session Dosage Given: 2 Gy
Session Number: 8

## 2021-12-09 ENCOUNTER — Ambulatory Visit
Admission: RE | Admit: 2021-12-09 | Discharge: 2021-12-09 | Disposition: A | Discharge status: Home / Self Care | Payer: PPO | Source: Ambulatory Visit | Attending: Radiation Oncology | Admitting: Radiation Oncology

## 2021-12-09 ENCOUNTER — Other Ambulatory Visit: Payer: Self-pay

## 2021-12-09 ENCOUNTER — Other Ambulatory Visit: Payer: Self-pay | Admitting: *Deleted

## 2021-12-09 DIAGNOSIS — C61 Malignant neoplasm of prostate: Secondary | ICD-10-CM | POA: Diagnosis not present

## 2021-12-09 LAB — RAD ONC ARIA SESSION SUMMARY
Course Elapsed Days: 12
Plan Fractions Treated to Date: 9
Plan Prescribed Dose Per Fraction: 2 Gy
Plan Total Fractions Prescribed: 40
Plan Total Prescribed Dose: 80 Gy
Reference Point Dosage Given to Date: 18 Gy
Reference Point Session Dosage Given: 2 Gy
Session Number: 9

## 2021-12-09 MED ORDER — TAMSULOSIN HCL 0.4 MG PO CAPS: 0.4000 mg | ORAL_CAPSULE | Freq: Every day | ORAL | 6 refills | Status: DC

## 2021-12-10 ENCOUNTER — Ambulatory Visit
Admission: RE | Admit: 2021-12-10 | Discharge: 2021-12-10 | Disposition: A | Discharge status: Home / Self Care | Payer: PPO | Source: Ambulatory Visit | Attending: Radiation Oncology | Admitting: Radiation Oncology

## 2021-12-10 ENCOUNTER — Telehealth: Payer: Self-pay

## 2021-12-10 ENCOUNTER — Other Ambulatory Visit: Payer: Self-pay

## 2021-12-10 ENCOUNTER — Inpatient Hospital Stay: Payer: PPO | Attending: Oncology

## 2021-12-10 DIAGNOSIS — C61 Malignant neoplasm of prostate: Secondary | ICD-10-CM | POA: Diagnosis not present

## 2021-12-10 DIAGNOSIS — R350 Frequency of micturition: Secondary | ICD-10-CM | POA: Diagnosis not present

## 2021-12-10 DIAGNOSIS — R300 Dysuria: Secondary | ICD-10-CM | POA: Diagnosis not present

## 2021-12-10 LAB — RAD ONC ARIA SESSION SUMMARY
Course Elapsed Days: 13
Plan Fractions Treated to Date: 10
Plan Prescribed Dose Per Fraction: 2 Gy
Plan Total Fractions Prescribed: 40
Plan Total Prescribed Dose: 80 Gy
Reference Point Dosage Given to Date: 20 Gy
Reference Point Session Dosage Given: 2 Gy
Session Number: 10

## 2021-12-10 LAB — CBC
HCT: 36.1 % — ABNORMAL LOW (ref 39.0–52.0)
Hemoglobin: 11.7 g/dL — ABNORMAL LOW (ref 13.0–17.0)
MCH: 27.7 pg (ref 26.0–34.0)
MCHC: 32.4 g/dL (ref 30.0–36.0)
MCV: 85.3 fL (ref 80.0–100.0)
Platelets: 180 10*3/uL (ref 150–400)
RBC: 4.23 MIL/uL (ref 4.22–5.81)
RDW: 14.5 % (ref 11.5–15.5)
WBC: 7.3 10*3/uL (ref 4.0–10.5)
nRBC: 0 % (ref 0.0–0.2)

## 2021-12-10 NOTE — Telephone Encounter (Signed)
Patient stopped by radiation desk before radiation stating that he was having burning with urination. Dr Baruch Gouty advised him to drink cranberry juice but he was still concerned. I advised patient to call PCP office to see if they would do a urine culture. Patient agreeable.

## 2021-12-11 ENCOUNTER — Other Ambulatory Visit: Payer: HMO

## 2021-12-11 ENCOUNTER — Ambulatory Visit
Admission: RE | Admit: 2021-12-11 | Discharge: 2021-12-11 | Disposition: A | Discharge status: Home / Self Care | Payer: PPO | Source: Ambulatory Visit | Attending: Radiation Oncology | Admitting: Radiation Oncology

## 2021-12-11 ENCOUNTER — Other Ambulatory Visit: Payer: Self-pay

## 2021-12-11 DIAGNOSIS — C61 Malignant neoplasm of prostate: Secondary | ICD-10-CM | POA: Diagnosis not present

## 2021-12-11 LAB — RAD ONC ARIA SESSION SUMMARY
Course Elapsed Days: 14
Plan Fractions Treated to Date: 11
Plan Prescribed Dose Per Fraction: 2 Gy
Plan Total Fractions Prescribed: 40
Plan Total Prescribed Dose: 80 Gy
Reference Point Dosage Given to Date: 22 Gy
Reference Point Session Dosage Given: 2 Gy
Session Number: 11

## 2021-12-12 ENCOUNTER — Other Ambulatory Visit: Payer: Self-pay

## 2021-12-12 ENCOUNTER — Ambulatory Visit
Admission: RE | Admit: 2021-12-12 | Discharge: 2021-12-12 | Disposition: A | Discharge status: Home / Self Care | Payer: PPO | Source: Ambulatory Visit | Attending: Radiation Oncology | Admitting: Radiation Oncology

## 2021-12-12 DIAGNOSIS — C61 Malignant neoplasm of prostate: Secondary | ICD-10-CM | POA: Diagnosis not present

## 2021-12-12 LAB — RAD ONC ARIA SESSION SUMMARY
Course Elapsed Days: 15
Plan Fractions Treated to Date: 12
Plan Prescribed Dose Per Fraction: 2 Gy
Plan Total Fractions Prescribed: 40
Plan Total Prescribed Dose: 80 Gy
Reference Point Dosage Given to Date: 24 Gy
Reference Point Session Dosage Given: 2 Gy
Session Number: 12

## 2021-12-15 ENCOUNTER — Other Ambulatory Visit: Payer: Self-pay

## 2021-12-15 ENCOUNTER — Ambulatory Visit
Admission: RE | Admit: 2021-12-15 | Discharge: 2021-12-15 | Disposition: A | Discharge status: Home / Self Care | Payer: PPO | Source: Ambulatory Visit | Attending: Radiation Oncology | Admitting: Radiation Oncology

## 2021-12-15 DIAGNOSIS — C61 Malignant neoplasm of prostate: Secondary | ICD-10-CM | POA: Diagnosis not present

## 2021-12-15 LAB — RAD ONC ARIA SESSION SUMMARY
Course Elapsed Days: 18
Plan Fractions Treated to Date: 13
Plan Prescribed Dose Per Fraction: 2 Gy
Plan Total Fractions Prescribed: 40
Plan Total Prescribed Dose: 80 Gy
Reference Point Dosage Given to Date: 26 Gy
Reference Point Session Dosage Given: 2 Gy
Session Number: 13

## 2021-12-16 ENCOUNTER — Other Ambulatory Visit: Payer: Self-pay

## 2021-12-16 ENCOUNTER — Ambulatory Visit
Admission: RE | Admit: 2021-12-16 | Discharge: 2021-12-16 | Disposition: A | Discharge status: Home / Self Care | Payer: PPO | Source: Ambulatory Visit | Attending: Radiation Oncology | Admitting: Radiation Oncology

## 2021-12-16 DIAGNOSIS — C61 Malignant neoplasm of prostate: Secondary | ICD-10-CM | POA: Insufficient documentation

## 2021-12-16 DIAGNOSIS — Z510 Encounter for antineoplastic radiation therapy: Secondary | ICD-10-CM | POA: Insufficient documentation

## 2021-12-16 DIAGNOSIS — Z191 Hormone sensitive malignancy status: Secondary | ICD-10-CM | POA: Diagnosis not present

## 2021-12-16 LAB — RAD ONC ARIA SESSION SUMMARY
Course Elapsed Days: 19
Plan Fractions Treated to Date: 14
Plan Prescribed Dose Per Fraction: 2 Gy
Plan Total Fractions Prescribed: 40
Plan Total Prescribed Dose: 80 Gy
Reference Point Dosage Given to Date: 28 Gy
Reference Point Session Dosage Given: 2 Gy
Session Number: 14

## 2021-12-17 ENCOUNTER — Other Ambulatory Visit: Payer: Self-pay | Admitting: *Deleted

## 2021-12-17 ENCOUNTER — Other Ambulatory Visit: Payer: Self-pay

## 2021-12-17 ENCOUNTER — Ambulatory Visit
Admission: RE | Admit: 2021-12-17 | Discharge: 2021-12-17 | Disposition: A | Discharge status: Home / Self Care | Payer: PPO | Source: Ambulatory Visit | Attending: Radiation Oncology | Admitting: Radiation Oncology

## 2021-12-17 DIAGNOSIS — Z510 Encounter for antineoplastic radiation therapy: Secondary | ICD-10-CM | POA: Diagnosis not present

## 2021-12-17 DIAGNOSIS — C61 Malignant neoplasm of prostate: Secondary | ICD-10-CM | POA: Diagnosis not present

## 2021-12-17 DIAGNOSIS — Z191 Hormone sensitive malignancy status: Secondary | ICD-10-CM | POA: Diagnosis not present

## 2021-12-17 LAB — RAD ONC ARIA SESSION SUMMARY
Course Elapsed Days: 20
Plan Fractions Treated to Date: 15
Plan Prescribed Dose Per Fraction: 2 Gy
Plan Total Fractions Prescribed: 40
Plan Total Prescribed Dose: 80 Gy
Reference Point Dosage Given to Date: 30 Gy
Reference Point Session Dosage Given: 2 Gy
Session Number: 15

## 2021-12-17 MED ORDER — CIPROFLOXACIN HCL 500 MG PO TABS: 500.0000 mg | ORAL_TABLET | Freq: Two times a day (BID) | ORAL | 0 refills | Status: DC

## 2021-12-18 ENCOUNTER — Ambulatory Visit
Admission: RE | Admit: 2021-12-18 | Discharge: 2021-12-18 | Disposition: A | Discharge status: Home / Self Care | Payer: PPO | Source: Ambulatory Visit | Attending: Radiation Oncology | Admitting: Radiation Oncology

## 2021-12-18 ENCOUNTER — Other Ambulatory Visit: Payer: Self-pay

## 2021-12-18 DIAGNOSIS — Z191 Hormone sensitive malignancy status: Secondary | ICD-10-CM | POA: Diagnosis not present

## 2021-12-18 DIAGNOSIS — C61 Malignant neoplasm of prostate: Secondary | ICD-10-CM | POA: Diagnosis not present

## 2021-12-18 DIAGNOSIS — Z510 Encounter for antineoplastic radiation therapy: Secondary | ICD-10-CM | POA: Diagnosis not present

## 2021-12-18 LAB — RAD ONC ARIA SESSION SUMMARY
Course Elapsed Days: 21
Plan Fractions Treated to Date: 16
Plan Prescribed Dose Per Fraction: 2 Gy
Plan Total Fractions Prescribed: 40
Plan Total Prescribed Dose: 80 Gy
Reference Point Dosage Given to Date: 32 Gy
Reference Point Session Dosage Given: 2 Gy
Session Number: 16

## 2021-12-19 ENCOUNTER — Ambulatory Visit
Admission: RE | Admit: 2021-12-19 | Discharge: 2021-12-19 | Disposition: A | Discharge status: Home / Self Care | Payer: PPO | Source: Ambulatory Visit | Attending: Radiation Oncology | Admitting: Radiation Oncology

## 2021-12-19 ENCOUNTER — Other Ambulatory Visit: Payer: Self-pay

## 2021-12-19 DIAGNOSIS — C61 Malignant neoplasm of prostate: Secondary | ICD-10-CM | POA: Diagnosis not present

## 2021-12-19 DIAGNOSIS — Z191 Hormone sensitive malignancy status: Secondary | ICD-10-CM | POA: Diagnosis not present

## 2021-12-19 DIAGNOSIS — Z510 Encounter for antineoplastic radiation therapy: Secondary | ICD-10-CM | POA: Diagnosis not present

## 2021-12-19 LAB — RAD ONC ARIA SESSION SUMMARY
Course Elapsed Days: 22
Plan Fractions Treated to Date: 17
Plan Prescribed Dose Per Fraction: 2 Gy
Plan Total Fractions Prescribed: 40
Plan Total Prescribed Dose: 80 Gy
Reference Point Dosage Given to Date: 34 Gy
Reference Point Session Dosage Given: 2 Gy
Session Number: 17

## 2021-12-22 ENCOUNTER — Ambulatory Visit
Admission: RE | Admit: 2021-12-22 | Discharge: 2021-12-22 | Disposition: A | Discharge status: Home / Self Care | Payer: PPO | Source: Ambulatory Visit | Attending: Radiation Oncology | Admitting: Radiation Oncology

## 2021-12-22 ENCOUNTER — Other Ambulatory Visit: Payer: Self-pay

## 2021-12-22 DIAGNOSIS — C61 Malignant neoplasm of prostate: Secondary | ICD-10-CM | POA: Diagnosis not present

## 2021-12-22 DIAGNOSIS — Z191 Hormone sensitive malignancy status: Secondary | ICD-10-CM | POA: Diagnosis not present

## 2021-12-22 DIAGNOSIS — Z510 Encounter for antineoplastic radiation therapy: Secondary | ICD-10-CM | POA: Diagnosis not present

## 2021-12-22 LAB — RAD ONC ARIA SESSION SUMMARY
Course Elapsed Days: 25
Plan Fractions Treated to Date: 18
Plan Prescribed Dose Per Fraction: 2 Gy
Plan Total Fractions Prescribed: 40
Plan Total Prescribed Dose: 80 Gy
Reference Point Dosage Given to Date: 36 Gy
Reference Point Session Dosage Given: 2 Gy
Session Number: 18

## 2021-12-23 ENCOUNTER — Other Ambulatory Visit: Payer: Self-pay

## 2021-12-23 ENCOUNTER — Ambulatory Visit
Admission: RE | Admit: 2021-12-23 | Discharge: 2021-12-23 | Disposition: A | Discharge status: Home / Self Care | Payer: PPO | Source: Ambulatory Visit | Attending: Radiation Oncology | Admitting: Radiation Oncology

## 2021-12-23 DIAGNOSIS — Z191 Hormone sensitive malignancy status: Secondary | ICD-10-CM | POA: Diagnosis not present

## 2021-12-23 DIAGNOSIS — Z510 Encounter for antineoplastic radiation therapy: Secondary | ICD-10-CM | POA: Diagnosis not present

## 2021-12-23 DIAGNOSIS — C61 Malignant neoplasm of prostate: Secondary | ICD-10-CM | POA: Diagnosis not present

## 2021-12-23 LAB — RAD ONC ARIA SESSION SUMMARY
Course Elapsed Days: 26
Plan Fractions Treated to Date: 19
Plan Prescribed Dose Per Fraction: 2 Gy
Plan Total Fractions Prescribed: 40
Plan Total Prescribed Dose: 80 Gy
Reference Point Dosage Given to Date: 38 Gy
Reference Point Session Dosage Given: 2 Gy
Session Number: 19

## 2021-12-24 ENCOUNTER — Ambulatory Visit
Admission: RE | Admit: 2021-12-24 | Discharge: 2021-12-24 | Disposition: A | Discharge status: Home / Self Care | Payer: PPO | Source: Ambulatory Visit | Attending: Radiation Oncology | Admitting: Radiation Oncology

## 2021-12-24 ENCOUNTER — Inpatient Hospital Stay: Payer: PPO | Attending: Oncology

## 2021-12-24 ENCOUNTER — Other Ambulatory Visit: Payer: Self-pay

## 2021-12-24 DIAGNOSIS — C61 Malignant neoplasm of prostate: Secondary | ICD-10-CM

## 2021-12-24 DIAGNOSIS — Z191 Hormone sensitive malignancy status: Secondary | ICD-10-CM | POA: Diagnosis not present

## 2021-12-24 DIAGNOSIS — Z510 Encounter for antineoplastic radiation therapy: Secondary | ICD-10-CM | POA: Diagnosis not present

## 2021-12-24 LAB — RAD ONC ARIA SESSION SUMMARY
Course Elapsed Days: 27
Plan Fractions Treated to Date: 20
Plan Prescribed Dose Per Fraction: 2 Gy
Plan Total Fractions Prescribed: 40
Plan Total Prescribed Dose: 80 Gy
Reference Point Dosage Given to Date: 40 Gy
Reference Point Session Dosage Given: 2 Gy
Session Number: 20

## 2021-12-24 LAB — CBC
HCT: 35.5 % — ABNORMAL LOW (ref 39.0–52.0)
Hemoglobin: 11.8 g/dL — ABNORMAL LOW (ref 13.0–17.0)
MCH: 28.1 pg (ref 26.0–34.0)
MCHC: 33.2 g/dL (ref 30.0–36.0)
MCV: 84.5 fL (ref 80.0–100.0)
Platelets: 213 10*3/uL (ref 150–400)
RBC: 4.2 MIL/uL — ABNORMAL LOW (ref 4.22–5.81)
RDW: 14.1 % (ref 11.5–15.5)
WBC: 5.3 10*3/uL (ref 4.0–10.5)
nRBC: 0 % (ref 0.0–0.2)

## 2021-12-25 ENCOUNTER — Other Ambulatory Visit: Payer: HMO

## 2021-12-25 ENCOUNTER — Other Ambulatory Visit: Payer: Self-pay

## 2021-12-25 ENCOUNTER — Ambulatory Visit
Admission: RE | Admit: 2021-12-25 | Discharge: 2021-12-25 | Disposition: A | Discharge status: Home / Self Care | Payer: PPO | Source: Ambulatory Visit | Attending: Radiation Oncology | Admitting: Radiation Oncology

## 2021-12-25 DIAGNOSIS — Z191 Hormone sensitive malignancy status: Secondary | ICD-10-CM | POA: Diagnosis not present

## 2021-12-25 DIAGNOSIS — C61 Malignant neoplasm of prostate: Secondary | ICD-10-CM | POA: Diagnosis not present

## 2021-12-25 DIAGNOSIS — Z510 Encounter for antineoplastic radiation therapy: Secondary | ICD-10-CM | POA: Diagnosis not present

## 2021-12-25 LAB — RAD ONC ARIA SESSION SUMMARY
Course Elapsed Days: 28
Plan Fractions Treated to Date: 21
Plan Prescribed Dose Per Fraction: 2 Gy
Plan Total Fractions Prescribed: 40
Plan Total Prescribed Dose: 80 Gy
Reference Point Dosage Given to Date: 42 Gy
Reference Point Session Dosage Given: 2 Gy
Session Number: 21

## 2021-12-26 ENCOUNTER — Other Ambulatory Visit: Payer: Self-pay

## 2021-12-26 ENCOUNTER — Ambulatory Visit
Admission: RE | Admit: 2021-12-26 | Discharge: 2021-12-26 | Disposition: A | Discharge status: Home / Self Care | Payer: PPO | Source: Ambulatory Visit | Attending: Radiation Oncology | Admitting: Radiation Oncology

## 2021-12-26 DIAGNOSIS — C61 Malignant neoplasm of prostate: Secondary | ICD-10-CM | POA: Diagnosis not present

## 2021-12-26 DIAGNOSIS — Z191 Hormone sensitive malignancy status: Secondary | ICD-10-CM | POA: Diagnosis not present

## 2021-12-26 DIAGNOSIS — Z510 Encounter for antineoplastic radiation therapy: Secondary | ICD-10-CM | POA: Diagnosis not present

## 2021-12-26 LAB — RAD ONC ARIA SESSION SUMMARY
Course Elapsed Days: 29
Plan Fractions Treated to Date: 22
Plan Prescribed Dose Per Fraction: 2 Gy
Plan Total Fractions Prescribed: 40
Plan Total Prescribed Dose: 80 Gy
Reference Point Dosage Given to Date: 44 Gy
Reference Point Session Dosage Given: 2 Gy
Session Number: 22

## 2021-12-29 ENCOUNTER — Other Ambulatory Visit: Payer: Self-pay

## 2021-12-29 ENCOUNTER — Ambulatory Visit
Admission: RE | Admit: 2021-12-29 | Discharge: 2021-12-29 | Disposition: A | Discharge status: Home / Self Care | Payer: PPO | Source: Ambulatory Visit | Attending: Radiation Oncology | Admitting: Radiation Oncology

## 2021-12-29 DIAGNOSIS — C61 Malignant neoplasm of prostate: Secondary | ICD-10-CM | POA: Diagnosis not present

## 2021-12-29 DIAGNOSIS — Z510 Encounter for antineoplastic radiation therapy: Secondary | ICD-10-CM | POA: Diagnosis not present

## 2021-12-29 DIAGNOSIS — Z191 Hormone sensitive malignancy status: Secondary | ICD-10-CM | POA: Diagnosis not present

## 2021-12-29 LAB — RAD ONC ARIA SESSION SUMMARY
Course Elapsed Days: 32
Plan Fractions Treated to Date: 23
Plan Prescribed Dose Per Fraction: 2 Gy
Plan Total Fractions Prescribed: 40
Plan Total Prescribed Dose: 80 Gy
Reference Point Dosage Given to Date: 46 Gy
Reference Point Session Dosage Given: 2 Gy
Session Number: 23

## 2021-12-30 ENCOUNTER — Ambulatory Visit
Admission: RE | Admit: 2021-12-30 | Discharge: 2021-12-30 | Disposition: A | Discharge status: Home / Self Care | Payer: PPO | Source: Ambulatory Visit | Attending: Radiation Oncology | Admitting: Radiation Oncology

## 2021-12-30 ENCOUNTER — Other Ambulatory Visit: Payer: Self-pay

## 2021-12-30 DIAGNOSIS — Z191 Hormone sensitive malignancy status: Secondary | ICD-10-CM | POA: Diagnosis not present

## 2021-12-30 DIAGNOSIS — Z510 Encounter for antineoplastic radiation therapy: Secondary | ICD-10-CM | POA: Diagnosis not present

## 2021-12-30 DIAGNOSIS — C61 Malignant neoplasm of prostate: Secondary | ICD-10-CM | POA: Diagnosis not present

## 2021-12-30 LAB — RAD ONC ARIA SESSION SUMMARY
Course Elapsed Days: 33
Plan Fractions Treated to Date: 24
Plan Prescribed Dose Per Fraction: 2 Gy
Plan Total Fractions Prescribed: 40
Plan Total Prescribed Dose: 80 Gy
Reference Point Dosage Given to Date: 48 Gy
Reference Point Session Dosage Given: 2 Gy
Session Number: 24

## 2021-12-31 ENCOUNTER — Ambulatory Visit
Admission: RE | Admit: 2021-12-31 | Discharge: 2021-12-31 | Disposition: A | Discharge status: Home / Self Care | Payer: PPO | Source: Ambulatory Visit | Attending: Radiation Oncology | Admitting: Radiation Oncology

## 2021-12-31 ENCOUNTER — Other Ambulatory Visit: Payer: Self-pay

## 2021-12-31 DIAGNOSIS — C61 Malignant neoplasm of prostate: Secondary | ICD-10-CM | POA: Diagnosis not present

## 2021-12-31 DIAGNOSIS — Z510 Encounter for antineoplastic radiation therapy: Secondary | ICD-10-CM | POA: Diagnosis not present

## 2021-12-31 DIAGNOSIS — Z191 Hormone sensitive malignancy status: Secondary | ICD-10-CM | POA: Diagnosis not present

## 2021-12-31 LAB — RAD ONC ARIA SESSION SUMMARY
Course Elapsed Days: 34
Plan Fractions Treated to Date: 25
Plan Prescribed Dose Per Fraction: 2 Gy
Plan Total Fractions Prescribed: 40
Plan Total Prescribed Dose: 80 Gy
Reference Point Dosage Given to Date: 50 Gy
Reference Point Session Dosage Given: 2 Gy
Session Number: 25

## 2022-01-01 ENCOUNTER — Other Ambulatory Visit: Payer: Self-pay

## 2022-01-01 ENCOUNTER — Ambulatory Visit
Admission: RE | Admit: 2022-01-01 | Discharge: 2022-01-01 | Disposition: A | Discharge status: Home / Self Care | Payer: PPO | Source: Ambulatory Visit | Attending: Radiation Oncology | Admitting: Radiation Oncology

## 2022-01-01 DIAGNOSIS — Z510 Encounter for antineoplastic radiation therapy: Secondary | ICD-10-CM | POA: Diagnosis not present

## 2022-01-01 DIAGNOSIS — C61 Malignant neoplasm of prostate: Secondary | ICD-10-CM | POA: Diagnosis not present

## 2022-01-01 DIAGNOSIS — Z191 Hormone sensitive malignancy status: Secondary | ICD-10-CM | POA: Diagnosis not present

## 2022-01-01 LAB — RAD ONC ARIA SESSION SUMMARY
Course Elapsed Days: 35
Plan Fractions Treated to Date: 26
Plan Prescribed Dose Per Fraction: 2 Gy
Plan Total Fractions Prescribed: 40
Plan Total Prescribed Dose: 80 Gy
Reference Point Dosage Given to Date: 52 Gy
Reference Point Session Dosage Given: 2 Gy
Session Number: 26

## 2022-01-02 ENCOUNTER — Other Ambulatory Visit: Payer: Self-pay

## 2022-01-02 ENCOUNTER — Ambulatory Visit
Admission: RE | Admit: 2022-01-02 | Discharge: 2022-01-02 | Disposition: A | Discharge status: Home / Self Care | Payer: PPO | Source: Ambulatory Visit | Attending: Radiation Oncology | Admitting: Radiation Oncology

## 2022-01-02 DIAGNOSIS — Z510 Encounter for antineoplastic radiation therapy: Secondary | ICD-10-CM | POA: Diagnosis not present

## 2022-01-02 DIAGNOSIS — C61 Malignant neoplasm of prostate: Secondary | ICD-10-CM | POA: Diagnosis not present

## 2022-01-02 DIAGNOSIS — Z191 Hormone sensitive malignancy status: Secondary | ICD-10-CM | POA: Diagnosis not present

## 2022-01-02 LAB — RAD ONC ARIA SESSION SUMMARY
Course Elapsed Days: 36
Plan Fractions Treated to Date: 27
Plan Prescribed Dose Per Fraction: 2 Gy
Plan Total Fractions Prescribed: 40
Plan Total Prescribed Dose: 80 Gy
Reference Point Dosage Given to Date: 54 Gy
Reference Point Session Dosage Given: 2 Gy
Session Number: 27

## 2022-01-05 ENCOUNTER — Other Ambulatory Visit: Payer: Self-pay

## 2022-01-05 ENCOUNTER — Ambulatory Visit
Admission: RE | Admit: 2022-01-05 | Discharge: 2022-01-05 | Disposition: A | Discharge status: Home / Self Care | Payer: PPO | Source: Ambulatory Visit | Attending: Radiation Oncology | Admitting: Radiation Oncology

## 2022-01-05 DIAGNOSIS — Z191 Hormone sensitive malignancy status: Secondary | ICD-10-CM | POA: Diagnosis not present

## 2022-01-05 DIAGNOSIS — Z510 Encounter for antineoplastic radiation therapy: Secondary | ICD-10-CM | POA: Diagnosis not present

## 2022-01-05 DIAGNOSIS — C61 Malignant neoplasm of prostate: Secondary | ICD-10-CM | POA: Diagnosis not present

## 2022-01-05 LAB — RAD ONC ARIA SESSION SUMMARY
Course Elapsed Days: 39
Plan Fractions Treated to Date: 28
Plan Prescribed Dose Per Fraction: 2 Gy
Plan Total Fractions Prescribed: 40
Plan Total Prescribed Dose: 80 Gy
Reference Point Dosage Given to Date: 56 Gy
Reference Point Session Dosage Given: 2 Gy
Session Number: 28

## 2022-01-06 ENCOUNTER — Ambulatory Visit
Admission: RE | Admit: 2022-01-06 | Discharge: 2022-01-06 | Disposition: A | Discharge status: Home / Self Care | Payer: PPO | Source: Ambulatory Visit | Attending: Radiation Oncology | Admitting: Radiation Oncology

## 2022-01-06 ENCOUNTER — Other Ambulatory Visit: Payer: Self-pay

## 2022-01-06 DIAGNOSIS — Z510 Encounter for antineoplastic radiation therapy: Secondary | ICD-10-CM | POA: Diagnosis not present

## 2022-01-06 DIAGNOSIS — Z191 Hormone sensitive malignancy status: Secondary | ICD-10-CM | POA: Diagnosis not present

## 2022-01-06 DIAGNOSIS — C61 Malignant neoplasm of prostate: Secondary | ICD-10-CM | POA: Diagnosis not present

## 2022-01-06 LAB — RAD ONC ARIA SESSION SUMMARY
Course Elapsed Days: 40
Plan Fractions Treated to Date: 29
Plan Prescribed Dose Per Fraction: 2 Gy
Plan Total Fractions Prescribed: 40
Plan Total Prescribed Dose: 80 Gy
Reference Point Dosage Given to Date: 58 Gy
Reference Point Session Dosage Given: 2 Gy
Session Number: 29

## 2022-01-07 ENCOUNTER — Ambulatory Visit
Admission: RE | Admit: 2022-01-07 | Discharge: 2022-01-07 | Disposition: A | Discharge status: Home / Self Care | Payer: PPO | Source: Ambulatory Visit | Attending: Radiation Oncology | Admitting: Radiation Oncology

## 2022-01-07 ENCOUNTER — Other Ambulatory Visit: Payer: Self-pay

## 2022-01-07 ENCOUNTER — Inpatient Hospital Stay: Payer: PPO

## 2022-01-07 DIAGNOSIS — Z191 Hormone sensitive malignancy status: Secondary | ICD-10-CM | POA: Diagnosis not present

## 2022-01-07 DIAGNOSIS — C61 Malignant neoplasm of prostate: Secondary | ICD-10-CM | POA: Diagnosis not present

## 2022-01-07 DIAGNOSIS — Z510 Encounter for antineoplastic radiation therapy: Secondary | ICD-10-CM | POA: Diagnosis not present

## 2022-01-07 LAB — CBC
HCT: 35.2 % — ABNORMAL LOW (ref 39.0–52.0)
Hemoglobin: 11.7 g/dL — ABNORMAL LOW (ref 13.0–17.0)
MCH: 27.9 pg (ref 26.0–34.0)
MCHC: 33.2 g/dL (ref 30.0–36.0)
MCV: 84 fL (ref 80.0–100.0)
Platelets: 186 10*3/uL (ref 150–400)
RBC: 4.19 MIL/uL — ABNORMAL LOW (ref 4.22–5.81)
RDW: 14.1 % (ref 11.5–15.5)
WBC: 5.2 10*3/uL (ref 4.0–10.5)
nRBC: 0 % (ref 0.0–0.2)

## 2022-01-07 LAB — RAD ONC ARIA SESSION SUMMARY
Course Elapsed Days: 41
Plan Fractions Treated to Date: 30
Plan Prescribed Dose Per Fraction: 2 Gy
Plan Total Fractions Prescribed: 40
Plan Total Prescribed Dose: 80 Gy
Reference Point Dosage Given to Date: 60 Gy
Reference Point Session Dosage Given: 2 Gy
Session Number: 30

## 2022-01-08 ENCOUNTER — Other Ambulatory Visit: Payer: HMO

## 2022-01-08 ENCOUNTER — Ambulatory Visit
Admission: RE | Admit: 2022-01-08 | Discharge: 2022-01-08 | Disposition: A | Discharge status: Home / Self Care | Payer: PPO | Source: Ambulatory Visit | Attending: Radiation Oncology | Admitting: Radiation Oncology

## 2022-01-08 ENCOUNTER — Other Ambulatory Visit: Payer: Self-pay

## 2022-01-08 DIAGNOSIS — Z510 Encounter for antineoplastic radiation therapy: Secondary | ICD-10-CM | POA: Diagnosis not present

## 2022-01-08 DIAGNOSIS — Z191 Hormone sensitive malignancy status: Secondary | ICD-10-CM | POA: Diagnosis not present

## 2022-01-08 DIAGNOSIS — C61 Malignant neoplasm of prostate: Secondary | ICD-10-CM | POA: Diagnosis not present

## 2022-01-08 LAB — RAD ONC ARIA SESSION SUMMARY
Course Elapsed Days: 42
Plan Fractions Treated to Date: 31
Plan Prescribed Dose Per Fraction: 2 Gy
Plan Total Fractions Prescribed: 40
Plan Total Prescribed Dose: 80 Gy
Reference Point Dosage Given to Date: 62 Gy
Reference Point Session Dosage Given: 2 Gy
Session Number: 31

## 2022-01-09 ENCOUNTER — Other Ambulatory Visit: Payer: Self-pay

## 2022-01-09 ENCOUNTER — Ambulatory Visit
Admission: RE | Admit: 2022-01-09 | Discharge: 2022-01-09 | Disposition: A | Discharge status: Home / Self Care | Payer: PPO | Source: Ambulatory Visit | Attending: Radiation Oncology | Admitting: Radiation Oncology

## 2022-01-09 DIAGNOSIS — Z510 Encounter for antineoplastic radiation therapy: Secondary | ICD-10-CM | POA: Diagnosis not present

## 2022-01-09 DIAGNOSIS — Z191 Hormone sensitive malignancy status: Secondary | ICD-10-CM | POA: Diagnosis not present

## 2022-01-09 DIAGNOSIS — C61 Malignant neoplasm of prostate: Secondary | ICD-10-CM | POA: Diagnosis not present

## 2022-01-09 LAB — RAD ONC ARIA SESSION SUMMARY
Course Elapsed Days: 43
Plan Fractions Treated to Date: 32
Plan Prescribed Dose Per Fraction: 2 Gy
Plan Total Fractions Prescribed: 40
Plan Total Prescribed Dose: 80 Gy
Reference Point Dosage Given to Date: 64 Gy
Reference Point Session Dosage Given: 2 Gy
Session Number: 32

## 2022-01-12 ENCOUNTER — Ambulatory Visit
Admission: RE | Admit: 2022-01-12 | Discharge: 2022-01-12 | Disposition: A | Discharge status: Home / Self Care | Payer: PPO | Source: Ambulatory Visit | Attending: Radiation Oncology | Admitting: Radiation Oncology

## 2022-01-12 ENCOUNTER — Other Ambulatory Visit: Payer: Self-pay

## 2022-01-12 DIAGNOSIS — Z510 Encounter for antineoplastic radiation therapy: Secondary | ICD-10-CM | POA: Diagnosis not present

## 2022-01-12 DIAGNOSIS — C61 Malignant neoplasm of prostate: Secondary | ICD-10-CM | POA: Diagnosis not present

## 2022-01-12 DIAGNOSIS — Z191 Hormone sensitive malignancy status: Secondary | ICD-10-CM | POA: Diagnosis not present

## 2022-01-12 LAB — RAD ONC ARIA SESSION SUMMARY
Course Elapsed Days: 46
Plan Fractions Treated to Date: 33
Plan Prescribed Dose Per Fraction: 2 Gy
Plan Total Fractions Prescribed: 40
Plan Total Prescribed Dose: 80 Gy
Reference Point Dosage Given to Date: 66 Gy
Reference Point Session Dosage Given: 2 Gy
Session Number: 33

## 2022-01-13 ENCOUNTER — Ambulatory Visit
Admission: RE | Admit: 2022-01-13 | Discharge: 2022-01-13 | Disposition: A | Discharge status: Home / Self Care | Payer: PPO | Source: Ambulatory Visit | Attending: Radiation Oncology | Admitting: Radiation Oncology

## 2022-01-13 ENCOUNTER — Other Ambulatory Visit: Payer: Self-pay

## 2022-01-13 DIAGNOSIS — C61 Malignant neoplasm of prostate: Secondary | ICD-10-CM | POA: Diagnosis not present

## 2022-01-13 DIAGNOSIS — Z510 Encounter for antineoplastic radiation therapy: Secondary | ICD-10-CM | POA: Diagnosis not present

## 2022-01-13 DIAGNOSIS — Z191 Hormone sensitive malignancy status: Secondary | ICD-10-CM | POA: Diagnosis not present

## 2022-01-13 LAB — RAD ONC ARIA SESSION SUMMARY
Course Elapsed Days: 47
Plan Fractions Treated to Date: 34
Plan Prescribed Dose Per Fraction: 2 Gy
Plan Total Fractions Prescribed: 40
Plan Total Prescribed Dose: 80 Gy
Reference Point Dosage Given to Date: 68 Gy
Reference Point Session Dosage Given: 2 Gy
Session Number: 34

## 2022-01-14 ENCOUNTER — Ambulatory Visit
Admission: RE | Admit: 2022-01-14 | Discharge: 2022-01-14 | Disposition: A | Discharge status: Home / Self Care | Payer: PPO | Source: Ambulatory Visit | Attending: Radiation Oncology | Admitting: Radiation Oncology

## 2022-01-14 ENCOUNTER — Other Ambulatory Visit: Payer: Self-pay

## 2022-01-14 DIAGNOSIS — J302 Other seasonal allergic rhinitis: Secondary | ICD-10-CM | POA: Diagnosis not present

## 2022-01-14 DIAGNOSIS — Z191 Hormone sensitive malignancy status: Secondary | ICD-10-CM | POA: Diagnosis not present

## 2022-01-14 DIAGNOSIS — C61 Malignant neoplasm of prostate: Secondary | ICD-10-CM | POA: Diagnosis not present

## 2022-01-14 DIAGNOSIS — J4520 Mild intermittent asthma, uncomplicated: Secondary | ICD-10-CM | POA: Diagnosis not present

## 2022-01-14 DIAGNOSIS — Z510 Encounter for antineoplastic radiation therapy: Secondary | ICD-10-CM | POA: Diagnosis not present

## 2022-01-14 LAB — RAD ONC ARIA SESSION SUMMARY
Course Elapsed Days: 48
Plan Fractions Treated to Date: 35
Plan Prescribed Dose Per Fraction: 2 Gy
Plan Total Fractions Prescribed: 40
Plan Total Prescribed Dose: 80 Gy
Reference Point Dosage Given to Date: 70 Gy
Reference Point Session Dosage Given: 2 Gy
Session Number: 35

## 2022-01-15 ENCOUNTER — Ambulatory Visit
Admission: RE | Admit: 2022-01-15 | Discharge: 2022-01-15 | Disposition: A | Discharge status: Home / Self Care | Payer: PPO | Source: Ambulatory Visit | Attending: Radiation Oncology | Admitting: Radiation Oncology

## 2022-01-15 ENCOUNTER — Other Ambulatory Visit: Payer: Self-pay

## 2022-01-15 DIAGNOSIS — C61 Malignant neoplasm of prostate: Secondary | ICD-10-CM | POA: Diagnosis not present

## 2022-01-15 DIAGNOSIS — Z510 Encounter for antineoplastic radiation therapy: Secondary | ICD-10-CM | POA: Diagnosis not present

## 2022-01-15 DIAGNOSIS — Z191 Hormone sensitive malignancy status: Secondary | ICD-10-CM | POA: Diagnosis not present

## 2022-01-15 LAB — RAD ONC ARIA SESSION SUMMARY
Course Elapsed Days: 49
Plan Fractions Treated to Date: 36
Plan Prescribed Dose Per Fraction: 2 Gy
Plan Total Fractions Prescribed: 40
Plan Total Prescribed Dose: 80 Gy
Reference Point Dosage Given to Date: 72 Gy
Reference Point Session Dosage Given: 2 Gy
Session Number: 36

## 2022-01-16 ENCOUNTER — Other Ambulatory Visit: Payer: Self-pay

## 2022-01-16 ENCOUNTER — Ambulatory Visit
Admission: RE | Admit: 2022-01-16 | Discharge: 2022-01-16 | Disposition: A | Discharge status: Home / Self Care | Payer: PPO | Source: Ambulatory Visit | Attending: Radiation Oncology | Admitting: Radiation Oncology

## 2022-01-16 DIAGNOSIS — C61 Malignant neoplasm of prostate: Secondary | ICD-10-CM | POA: Insufficient documentation

## 2022-01-16 DIAGNOSIS — Z191 Hormone sensitive malignancy status: Secondary | ICD-10-CM | POA: Diagnosis not present

## 2022-01-16 DIAGNOSIS — Z510 Encounter for antineoplastic radiation therapy: Secondary | ICD-10-CM | POA: Diagnosis not present

## 2022-01-16 LAB — RAD ONC ARIA SESSION SUMMARY
Course Elapsed Days: 50
Plan Fractions Treated to Date: 37
Plan Prescribed Dose Per Fraction: 2 Gy
Plan Total Fractions Prescribed: 40
Plan Total Prescribed Dose: 80 Gy
Reference Point Dosage Given to Date: 74 Gy
Reference Point Session Dosage Given: 2 Gy
Session Number: 37

## 2022-01-20 ENCOUNTER — Ambulatory Visit
Admission: RE | Admit: 2022-01-20 | Discharge: 2022-01-20 | Disposition: A | Discharge status: Home / Self Care | Payer: PPO | Source: Ambulatory Visit | Attending: Radiation Oncology | Admitting: Radiation Oncology

## 2022-01-20 ENCOUNTER — Other Ambulatory Visit: Payer: Self-pay

## 2022-01-20 DIAGNOSIS — C61 Malignant neoplasm of prostate: Secondary | ICD-10-CM | POA: Diagnosis not present

## 2022-01-20 DIAGNOSIS — Z510 Encounter for antineoplastic radiation therapy: Secondary | ICD-10-CM | POA: Diagnosis not present

## 2022-01-20 DIAGNOSIS — Z191 Hormone sensitive malignancy status: Secondary | ICD-10-CM | POA: Diagnosis not present

## 2022-01-20 LAB — RAD ONC ARIA SESSION SUMMARY
Course Elapsed Days: 54
Plan Fractions Treated to Date: 38
Plan Prescribed Dose Per Fraction: 2 Gy
Plan Total Fractions Prescribed: 40
Plan Total Prescribed Dose: 80 Gy
Reference Point Dosage Given to Date: 76 Gy
Reference Point Session Dosage Given: 2 Gy
Session Number: 38

## 2022-01-21 ENCOUNTER — Inpatient Hospital Stay: Payer: PPO

## 2022-01-21 ENCOUNTER — Ambulatory Visit
Admission: RE | Admit: 2022-01-21 | Discharge: 2022-01-21 | Disposition: A | Discharge status: Home / Self Care | Payer: PPO | Source: Ambulatory Visit | Attending: Radiation Oncology | Admitting: Radiation Oncology

## 2022-01-21 ENCOUNTER — Other Ambulatory Visit: Payer: Self-pay

## 2022-01-21 DIAGNOSIS — C61 Malignant neoplasm of prostate: Secondary | ICD-10-CM | POA: Diagnosis not present

## 2022-01-21 DIAGNOSIS — E782 Mixed hyperlipidemia: Secondary | ICD-10-CM | POA: Diagnosis not present

## 2022-01-21 DIAGNOSIS — Z191 Hormone sensitive malignancy status: Secondary | ICD-10-CM | POA: Diagnosis not present

## 2022-01-21 DIAGNOSIS — Z862 Personal history of diseases of the blood and blood-forming organs and certain disorders involving the immune mechanism: Secondary | ICD-10-CM | POA: Diagnosis not present

## 2022-01-21 DIAGNOSIS — E1169 Type 2 diabetes mellitus with other specified complication: Secondary | ICD-10-CM | POA: Diagnosis not present

## 2022-01-21 DIAGNOSIS — I10 Essential (primary) hypertension: Secondary | ICD-10-CM | POA: Diagnosis not present

## 2022-01-21 DIAGNOSIS — N289 Disorder of kidney and ureter, unspecified: Secondary | ICD-10-CM | POA: Diagnosis not present

## 2022-01-21 DIAGNOSIS — E785 Hyperlipidemia, unspecified: Secondary | ICD-10-CM | POA: Diagnosis not present

## 2022-01-21 DIAGNOSIS — Z510 Encounter for antineoplastic radiation therapy: Secondary | ICD-10-CM | POA: Diagnosis not present

## 2022-01-21 LAB — RAD ONC ARIA SESSION SUMMARY
Course Elapsed Days: 55
Plan Fractions Treated to Date: 39
Plan Prescribed Dose Per Fraction: 2 Gy
Plan Total Fractions Prescribed: 40
Plan Total Prescribed Dose: 80 Gy
Reference Point Dosage Given to Date: 78 Gy
Reference Point Session Dosage Given: 2 Gy
Session Number: 39

## 2022-01-22 ENCOUNTER — Ambulatory Visit
Admission: RE | Admit: 2022-01-22 | Discharge: 2022-01-22 | Disposition: A | Discharge status: Home / Self Care | Payer: PPO | Source: Ambulatory Visit | Attending: Radiation Oncology | Admitting: Radiation Oncology

## 2022-01-22 ENCOUNTER — Other Ambulatory Visit: Payer: HMO

## 2022-01-22 ENCOUNTER — Other Ambulatory Visit: Payer: Self-pay

## 2022-01-22 DIAGNOSIS — C61 Malignant neoplasm of prostate: Secondary | ICD-10-CM | POA: Diagnosis not present

## 2022-01-22 DIAGNOSIS — Z510 Encounter for antineoplastic radiation therapy: Secondary | ICD-10-CM | POA: Diagnosis not present

## 2022-01-22 DIAGNOSIS — Z191 Hormone sensitive malignancy status: Secondary | ICD-10-CM | POA: Diagnosis not present

## 2022-01-22 LAB — RAD ONC ARIA SESSION SUMMARY
Course Elapsed Days: 56
Plan Fractions Treated to Date: 40
Plan Prescribed Dose Per Fraction: 2 Gy
Plan Total Fractions Prescribed: 40
Plan Total Prescribed Dose: 80 Gy
Reference Point Dosage Given to Date: 80 Gy
Reference Point Session Dosage Given: 2 Gy
Session Number: 40

## 2022-01-28 DIAGNOSIS — E785 Hyperlipidemia, unspecified: Secondary | ICD-10-CM | POA: Diagnosis not present

## 2022-01-28 DIAGNOSIS — Z0000 Encounter for general adult medical examination without abnormal findings: Secondary | ICD-10-CM | POA: Diagnosis not present

## 2022-01-28 DIAGNOSIS — I10 Essential (primary) hypertension: Secondary | ICD-10-CM | POA: Diagnosis not present

## 2022-01-28 DIAGNOSIS — E1169 Type 2 diabetes mellitus with other specified complication: Secondary | ICD-10-CM | POA: Diagnosis not present

## 2022-01-28 DIAGNOSIS — E782 Mixed hyperlipidemia: Secondary | ICD-10-CM | POA: Diagnosis not present

## 2022-01-28 DIAGNOSIS — Z862 Personal history of diseases of the blood and blood-forming organs and certain disorders involving the immune mechanism: Secondary | ICD-10-CM | POA: Diagnosis not present

## 2022-02-25 ENCOUNTER — Ambulatory Visit
Admission: RE | Admit: 2022-02-25 | Discharge: 2022-02-25 | Disposition: A | Discharge status: Home / Self Care | Payer: PPO | Source: Ambulatory Visit | Attending: Radiation Oncology | Admitting: Radiation Oncology

## 2022-02-25 ENCOUNTER — Other Ambulatory Visit: Payer: Self-pay | Admitting: *Deleted

## 2022-02-25 VITALS — BP 134/66 | HR 59 | Resp 16 | Ht 68.0 in | Wt 177.5 lb

## 2022-02-25 DIAGNOSIS — C61 Malignant neoplasm of prostate: Secondary | ICD-10-CM

## 2022-02-25 NOTE — Progress Notes (Signed)
Radiation Oncology Follow up Note  Name: Alexander Davidson   Date:   02/25/2022 MRN:  [id number] DOB: 1945-11-29    This 76 y.o. male presents to the clinic today for 1 month follow-up status post IMRT radiation therapy for stage IIIc (T2 N0 M0) Gleason 9 (4+5) adenocarcinoma the prostate presenting with a PSA of 10.6.  REFERRING PROVIDER: Dion Body, MD  HPI: Patient is a 76 year old male now at 1 month having completed IMRT prostate to prostate and pelvic nodes for stage IIIc Gleason 9 adenocarcinoma the prostate.  Seen today in routine follow-up he is doing well side effects have subsided he is having very little increased lower urinary tract symptoms at this time bowels are still occasionally loose although much improved..  COMPLICATIONS OF TREATMENT: none  FOLLOW UP COMPLIANCE: keeps appointments   PHYSICAL EXAM:  BP 134/66   Pulse (!) 59   Resp 16   Ht '5\' 8"'$  (1.727 m)   Wt 177 lb 8 oz (80.5 kg)   BMI 26.99 kg/m  Well-developed well-nourished patient in NAD. HEENT reveals PERLA, EOMI, discs not visualized.  Oral cavity is clear. No oral mucosal lesions are identified. Neck is clear without evidence of cervical or supraclavicular adenopathy. Lungs are clear to A&P. Cardiac examination is essentially unremarkable with regular rate and rhythm without murmur rub or thrill. Abdomen is benign with no organomegaly or masses noted. Motor sensory and DTR levels are equal and symmetric in the upper and lower extremities. Cranial nerves II through XII are grossly intact. Proprioception is intact. No peripheral adenopathy or edema is identified. No motor or sensory levels are noted. Crude visual fields are within normal range.  RADIOLOGY RESULTS: No current films for review  PLAN: Present time patient is doing well 1 month out from IMRT radiation therapy for prostate cancer.  He continues on ADT therapy I believe urology will continue that for at least 2 years based on the Gleason 9  score.  I have asked to see him back in 3 months with a PSA.  Patient knows to call with any concerns.  I would like to take this opportunity to thank you for allowing me to participate in the care of your patient.Noreene Filbert, MD

## 2022-04-27 DIAGNOSIS — E782 Mixed hyperlipidemia: Secondary | ICD-10-CM | POA: Diagnosis not present

## 2022-04-27 DIAGNOSIS — E785 Hyperlipidemia, unspecified: Secondary | ICD-10-CM | POA: Diagnosis not present

## 2022-04-27 DIAGNOSIS — Z862 Personal history of diseases of the blood and blood-forming organs and certain disorders involving the immune mechanism: Secondary | ICD-10-CM | POA: Diagnosis not present

## 2022-04-27 DIAGNOSIS — E1169 Type 2 diabetes mellitus with other specified complication: Secondary | ICD-10-CM | POA: Diagnosis not present

## 2022-05-04 DIAGNOSIS — I10 Essential (primary) hypertension: Secondary | ICD-10-CM | POA: Diagnosis not present

## 2022-05-04 DIAGNOSIS — E1169 Type 2 diabetes mellitus with other specified complication: Secondary | ICD-10-CM | POA: Diagnosis not present

## 2022-05-04 DIAGNOSIS — E782 Mixed hyperlipidemia: Secondary | ICD-10-CM | POA: Diagnosis not present

## 2022-05-04 DIAGNOSIS — Z862 Personal history of diseases of the blood and blood-forming organs and certain disorders involving the immune mechanism: Secondary | ICD-10-CM | POA: Diagnosis not present

## 2022-05-04 DIAGNOSIS — E785 Hyperlipidemia, unspecified: Secondary | ICD-10-CM | POA: Diagnosis not present

## 2022-05-12 ENCOUNTER — Inpatient Hospital Stay: Payer: PPO | Attending: Radiation Oncology

## 2022-05-12 ENCOUNTER — Other Ambulatory Visit: Payer: Self-pay | Admitting: *Deleted

## 2022-05-12 DIAGNOSIS — C61 Malignant neoplasm of prostate: Secondary | ICD-10-CM | POA: Diagnosis not present

## 2022-05-12 DIAGNOSIS — Z923 Personal history of irradiation: Secondary | ICD-10-CM | POA: Diagnosis not present

## 2022-05-13 LAB — PSA: Prostatic Specific Antigen: 0.04 ng/mL (ref 0.00–4.00)

## 2022-05-14 ENCOUNTER — Ambulatory Visit
Admission: RE | Admit: 2022-05-14 | Discharge: 2022-05-14 | Disposition: A | Discharge status: Home / Self Care | Payer: PPO | Source: Ambulatory Visit | Attending: Radiation Oncology | Admitting: Radiation Oncology

## 2022-05-14 ENCOUNTER — Encounter: Payer: Self-pay | Admitting: Radiation Oncology

## 2022-05-14 VITALS — BP 115/66 | HR 63 | Temp 97.2°F | Ht 68.0 in | Wt 175.7 lb

## 2022-05-14 DIAGNOSIS — R351 Nocturia: Secondary | ICD-10-CM | POA: Insufficient documentation

## 2022-05-14 DIAGNOSIS — Z191 Hormone sensitive malignancy status: Secondary | ICD-10-CM | POA: Diagnosis not present

## 2022-05-14 DIAGNOSIS — D649 Anemia, unspecified: Secondary | ICD-10-CM | POA: Insufficient documentation

## 2022-05-14 DIAGNOSIS — C61 Malignant neoplasm of prostate: Secondary | ICD-10-CM | POA: Insufficient documentation

## 2022-05-14 DIAGNOSIS — Z923 Personal history of irradiation: Secondary | ICD-10-CM | POA: Insufficient documentation

## 2022-05-14 NOTE — Progress Notes (Signed)
Radiation Oncology Follow up Note  Name: Alexander Davidson   Date:   05/14/2022 MRN:  [id number] DOB: [birth year]    This 76 y.o. male presents to the clinic today for follow-up status post IMRT radiation (T2 N0 M0) Gleason 9 (4+5) adenocarcinoma the prostate presents with a PSA of 10.6.  REFERRING PROVIDER: Dion Body, MD  HPI: Patient is a 76 year old male now out for months having completed IMRT process to his prostate and pelvic nodes for stage IIIc Gleason 9 adenocarcinoma.Marland Kitchen  He is seen today in routine follow-up he is doing fairly well.  He does have significant nocturia although is not taking Flomax he claims it is raised his A1c.  He is also slightly anemic that has not been followed up yet by his GP.  Patient has never had a colonoscopy.  His most recent PSA is 6.83.  COMPLICATIONS OF TREATMENT: none  FOLLOW UP COMPLIANCE: keeps appointments   PHYSICAL EXAM:  BP 115/66   Pulse 63   Temp (!) 97.2 F (36.2 C)   Ht '5\' 8"'$  (1.727 m)   Wt 175 lb 11.2 oz (79.7 kg)   BMI 26.72 kg/m  Well-developed well-nourished patient in NAD. HEENT reveals PERLA, EOMI, discs not visualized.  Oral cavity is clear. No oral mucosal lesions are identified. Neck is clear without evidence of cervical or supraclavicular adenopathy. Lungs are clear to A&P. Cardiac examination is essentially unremarkable with regular rate and rhythm without murmur rub or thrill. Abdomen is benign with no organomegaly or masses noted. Motor sensory and DTR levels are equal and symmetric in the upper and lower extremities. Cranial nerves II through XII are grossly intact. Proprioception is intact. No peripheral adenopathy or edema is identified. No motor or sensory levels are noted. Crude visual fields are within normal range.  RADIOLOGY RESULTS: No current films for review  PLAN: Present time patient is doing well from a urologic standpoint with excellent response to radiation with a PSA of 1.04.  I have asked him to  contact his PMD for possible evaluation of his anemia he may need an initial colonoscopy which she has never had.  I have also asked him to return to urology to Dr. Bernardo Heater would like him to continue on ADT therapy.  I have asked to see him back in 6 months for follow-up.  Will have a repeat PSA at that time.  Patient knows to call with any concerns.  I would like to take this opportunity to thank you for allowing me to participate in the care of your patient.Noreene Filbert, MD

## 2022-05-15 ENCOUNTER — Ambulatory Visit (INDEPENDENT_AMBULATORY_CARE_PROVIDER_SITE_OTHER): Payer: PPO | Admitting: Physician Assistant

## 2022-05-15 DIAGNOSIS — C61 Malignant neoplasm of prostate: Secondary | ICD-10-CM | POA: Diagnosis not present

## 2022-05-15 MED ORDER — LEUPROLIDE ACETATE (6 MONTH) 45 MG ~~LOC~~ KIT
45.0000 mg | PACK | Freq: Once | SUBCUTANEOUS | Status: AC
  Administered 2022-05-15: 45 mg via SUBCUTANEOUS

## 2022-05-15 MED ORDER — ELIGARD 45 MG ~~LOC~~ KIT: 45.0000 mg | PACK | SUBCUTANEOUS | 1 refills | Status: DC

## 2022-05-15 NOTE — Progress Notes (Signed)
Eligard SubQ Injection   Due to Prostate Cancer patient is present today for a Eligard Injection.  Medication: Eligard 6 month Dose: 45 mg  Location: right arm Lot: [id number] Exp: 05/2023  Patient tolerated well, no complications were noted.  Performed by: Gordy Clement, [place]  Per Dr. Bernardo Heater patient is to continue therapy for 2 years . Patient's next follow up was scheduled for November 12, 2022. This appointment was scheduled using wheel and given to patient today along with reminder continue on Vitamin D 800-1000iu and Calcium 1000-'1200mg'$  daily while on Androgen Deprivation Therapy.  PA approval dates: No PA required

## 2022-05-21 ENCOUNTER — Other Ambulatory Visit: Payer: PPO

## 2022-05-28 ENCOUNTER — Ambulatory Visit: Payer: PPO | Admitting: Radiation Oncology

## 2022-08-17 ENCOUNTER — Ambulatory Visit: Payer: PPO | Admitting: Urology

## 2022-08-17 DIAGNOSIS — R300 Dysuria: Secondary | ICD-10-CM | POA: Diagnosis not present

## 2022-08-31 DIAGNOSIS — H2513 Age-related nuclear cataract, bilateral: Secondary | ICD-10-CM | POA: Diagnosis not present

## 2022-08-31 DIAGNOSIS — E119 Type 2 diabetes mellitus without complications: Secondary | ICD-10-CM | POA: Diagnosis not present

## 2022-10-20 ENCOUNTER — Telehealth: Payer: Self-pay | Admitting: *Deleted

## 2022-10-20 NOTE — Telephone Encounter (Signed)
Patient called stating that he has lab with his PCP 6/12 and with Dr Salena Saner 6/25. He wants to have labs all drawn at PCP office and not come 6/25 for PSA draw here. He has a call in to CP to request PSA be drawn there. Please return his call to discuss this

## 2022-10-28 DIAGNOSIS — R972 Elevated prostate specific antigen [PSA]: Secondary | ICD-10-CM | POA: Diagnosis not present

## 2022-10-28 DIAGNOSIS — C61 Malignant neoplasm of prostate: Secondary | ICD-10-CM | POA: Diagnosis not present

## 2022-10-28 DIAGNOSIS — Z862 Personal history of diseases of the blood and blood-forming organs and certain disorders involving the immune mechanism: Secondary | ICD-10-CM | POA: Diagnosis not present

## 2022-10-28 DIAGNOSIS — E782 Mixed hyperlipidemia: Secondary | ICD-10-CM | POA: Diagnosis not present

## 2022-10-28 DIAGNOSIS — E1169 Type 2 diabetes mellitus with other specified complication: Secondary | ICD-10-CM | POA: Diagnosis not present

## 2022-11-04 ENCOUNTER — Telehealth: Payer: Self-pay | Admitting: Physician Assistant

## 2022-11-04 DIAGNOSIS — Z862 Personal history of diseases of the blood and blood-forming organs and certain disorders involving the immune mechanism: Secondary | ICD-10-CM | POA: Diagnosis not present

## 2022-11-04 DIAGNOSIS — E782 Mixed hyperlipidemia: Secondary | ICD-10-CM | POA: Diagnosis not present

## 2022-11-04 DIAGNOSIS — I10 Essential (primary) hypertension: Secondary | ICD-10-CM | POA: Diagnosis not present

## 2022-11-04 DIAGNOSIS — Z8546 Personal history of malignant neoplasm of prostate: Secondary | ICD-10-CM | POA: Diagnosis not present

## 2022-11-04 DIAGNOSIS — N1831 Chronic kidney disease, stage 3a: Secondary | ICD-10-CM | POA: Diagnosis not present

## 2022-11-04 DIAGNOSIS — E1169 Type 2 diabetes mellitus with other specified complication: Secondary | ICD-10-CM | POA: Diagnosis not present

## 2022-11-04 DIAGNOSIS — E785 Hyperlipidemia, unspecified: Secondary | ICD-10-CM | POA: Diagnosis not present

## 2022-11-04 NOTE — Telephone Encounter (Signed)
Patient called regarding his appt on 11/12/22 for Eligard. He said his PSA was checked last week with his primary doctor, and it is down to 0.1 now. He asked if he still needed to keep this appointment. Please advise patient.

## 2022-11-05 ENCOUNTER — Telehealth: Payer: Self-pay

## 2022-11-05 NOTE — Telephone Encounter (Signed)
LVM for pt to return call. He will still need to come to his appointment on 11/12/2022 with Sam.

## 2022-11-10 ENCOUNTER — Other Ambulatory Visit: Payer: PPO

## 2022-11-12 ENCOUNTER — Ambulatory Visit: Payer: PPO | Admitting: Physician Assistant

## 2022-11-12 VITALS — BP 159/73 | HR 60

## 2022-11-12 DIAGNOSIS — C61 Malignant neoplasm of prostate: Secondary | ICD-10-CM

## 2022-11-12 MED ORDER — LEUPROLIDE ACETATE (6 MONTH) 45 MG ~~LOC~~ KIT
45.0000 mg | PACK | Freq: Once | SUBCUTANEOUS | Status: AC
Start: 2022-11-12 — End: 2022-11-12
  Administered 2022-11-12: 45 mg via SUBCUTANEOUS

## 2022-11-12 NOTE — Progress Notes (Signed)
Eligard SubQ Injection   Due to Prostate Cancer patient is present today for a Eligard Injection.  Medication: Eligard 6 month Dose: 45 mg  Location: right arm Lot: [id number] Exp: 01/17/2024  Patient tolerated well, no complications were noted  Performed by: Randa Lynn, RMA  Per Dr. Lonna Cobb patient is to continue therapy for 2 years . Patient's next follow up was scheduled for December 27th 2024 . This appointment was scheduled using wheel and given to patient today along with reminder continue on Vitamin D 800-1000iu and Calcium 1000-1200mg  daily while on Androgen Deprivation Therapy.  PA approval dates:

## 2022-11-17 ENCOUNTER — Ambulatory Visit
Admission: RE | Admit: 2022-11-17 | Discharge: 2022-11-17 | Disposition: A | Discharge status: Home / Self Care | Payer: PPO | Source: Ambulatory Visit | Attending: Radiation Oncology | Admitting: Radiation Oncology

## 2022-11-17 VITALS — BP 124/60 | HR 65 | Temp 98.2°F | Resp 12 | Wt 180.0 lb

## 2022-11-17 DIAGNOSIS — C775 Secondary and unspecified malignant neoplasm of intrapelvic lymph nodes: Secondary | ICD-10-CM | POA: Diagnosis not present

## 2022-11-17 DIAGNOSIS — R197 Diarrhea, unspecified: Secondary | ICD-10-CM | POA: Diagnosis not present

## 2022-11-17 DIAGNOSIS — Z191 Hormone sensitive malignancy status: Secondary | ICD-10-CM | POA: Diagnosis not present

## 2022-11-17 DIAGNOSIS — C61 Malignant neoplasm of prostate: Secondary | ICD-10-CM | POA: Diagnosis present

## 2022-11-17 DIAGNOSIS — R351 Nocturia: Secondary | ICD-10-CM | POA: Diagnosis not present

## 2022-11-17 DIAGNOSIS — Z923 Personal history of irradiation: Secondary | ICD-10-CM | POA: Insufficient documentation

## 2022-11-17 DIAGNOSIS — R350 Frequency of micturition: Secondary | ICD-10-CM | POA: Insufficient documentation

## 2022-11-17 NOTE — Progress Notes (Signed)
Radiation Oncology Follow up Note  Name: Alexander Davidson   Date:   11/17/2022 MRN:  [id number] DOB: [birth year]    This 77 y.o. male presents to the clinic today for 47-month follow-up status post post IMRT radiation therapy for a Gleason 9 (4+5) adenocarcinoma the prostate presenting with a PSA of 10.6.  REFERRING PROVIDER: Marisue Ivan, MD  HPI: Patient is a 77 year old male now out 10 months having completed IMRT radiation therapy to his prostate and pelvic nodes for Gleason 9 adenocarcinoma.  Seen today in routine follow-up he states he has urinary frequency and nocturia x 6-7.  He has refused to try Flomax.  He also some slight diarrhea although does not follow any low residue diet or any dietary restrictions.  His most recent PSA this past month was 0.01.Marland Kitchen  He is on ADT therapy.  He is followed by Dr. Burnadette Pop for anemia which predates his radiation treatments  COMPLICATIONS OF TREATMENT: none  FOLLOW UP COMPLIANCE: keeps appointments   PHYSICAL EXAM:  BP 124/60 (BP Location: Left Arm, Patient Position: Sitting, Cuff Size: Normal)   Pulse 65   Temp 98.2 F (36.8 C) (Tympanic)   Resp 12   Wt 180 lb (81.6 kg)   SpO2 99%   BMI 27.37 kg/m  Well-developed well-nourished patient in NAD. HEENT reveals PERLA, EOMI, discs not visualized.  Oral cavity is clear. No oral mucosal lesions are identified. Neck is clear without evidence of cervical or supraclavicular adenopathy. Lungs are clear to A&P. Cardiac examination is essentially unremarkable with regular rate and rhythm without murmur rub or thrill. Abdomen is benign with no organomegaly or masses noted. Motor sensory and DTR levels are equal and symmetric in the upper and lower extremities. Cranial nerves II through XII are grossly intact. Proprioception is intact. No peripheral adenopathy or edema is identified. No motor or sensory levels are noted. Crude visual fields are within normal range.  RADIOLOGY RESULTS: No current films  for review  PLAN: Present time he is under excellent biochemical control of his prostate cancer.  He continues on ADT therapy.  I have asked to see him back in 6 months for follow-up with a repeat PSA.  I have advised him to see urology for about his urinary symptoms.  Patient is to call with any concerns  I would like to take this opportunity to thank you for allowing me to participate in the care of your patient.[name], MD

## 2023-01-11 DIAGNOSIS — J4520 Mild intermittent asthma, uncomplicated: Secondary | ICD-10-CM | POA: Diagnosis not present

## 2023-01-11 DIAGNOSIS — J301 Allergic rhinitis due to pollen: Secondary | ICD-10-CM | POA: Diagnosis not present

## 2023-02-10 DIAGNOSIS — J449 Chronic obstructive pulmonary disease, unspecified: Secondary | ICD-10-CM | POA: Diagnosis not present

## 2023-02-10 DIAGNOSIS — J441 Chronic obstructive pulmonary disease with (acute) exacerbation: Secondary | ICD-10-CM | POA: Diagnosis not present

## 2023-04-30 DIAGNOSIS — E782 Mixed hyperlipidemia: Secondary | ICD-10-CM | POA: Diagnosis not present

## 2023-04-30 DIAGNOSIS — E1169 Type 2 diabetes mellitus with other specified complication: Secondary | ICD-10-CM | POA: Diagnosis not present

## 2023-04-30 DIAGNOSIS — I10 Essential (primary) hypertension: Secondary | ICD-10-CM | POA: Diagnosis not present

## 2023-04-30 DIAGNOSIS — Z862 Personal history of diseases of the blood and blood-forming organs and certain disorders involving the immune mechanism: Secondary | ICD-10-CM | POA: Diagnosis not present

## 2023-04-30 DIAGNOSIS — Z8546 Personal history of malignant neoplasm of prostate: Secondary | ICD-10-CM | POA: Diagnosis not present

## 2023-04-30 DIAGNOSIS — E785 Hyperlipidemia, unspecified: Secondary | ICD-10-CM | POA: Diagnosis not present

## 2023-05-04 ENCOUNTER — Other Ambulatory Visit: Payer: PPO

## 2023-05-07 DIAGNOSIS — Z8546 Personal history of malignant neoplasm of prostate: Secondary | ICD-10-CM | POA: Diagnosis not present

## 2023-05-07 DIAGNOSIS — E1169 Type 2 diabetes mellitus with other specified complication: Secondary | ICD-10-CM | POA: Diagnosis not present

## 2023-05-07 DIAGNOSIS — Z862 Personal history of diseases of the blood and blood-forming organs and certain disorders involving the immune mechanism: Secondary | ICD-10-CM | POA: Diagnosis not present

## 2023-05-07 DIAGNOSIS — E785 Hyperlipidemia, unspecified: Secondary | ICD-10-CM | POA: Diagnosis not present

## 2023-05-07 DIAGNOSIS — E782 Mixed hyperlipidemia: Secondary | ICD-10-CM | POA: Diagnosis not present

## 2023-05-07 DIAGNOSIS — I10 Essential (primary) hypertension: Secondary | ICD-10-CM | POA: Diagnosis not present

## 2023-05-07 DIAGNOSIS — Z0000 Encounter for general adult medical examination without abnormal findings: Secondary | ICD-10-CM | POA: Diagnosis not present

## 2023-05-13 ENCOUNTER — Ambulatory Visit: Payer: PPO | Admitting: Physician Assistant

## 2023-05-13 ENCOUNTER — Ambulatory Visit: Payer: PPO | Admitting: Urology

## 2023-05-17 ENCOUNTER — Ambulatory Visit: Payer: PPO | Admitting: Physician Assistant

## 2023-05-17 DIAGNOSIS — C61 Malignant neoplasm of prostate: Secondary | ICD-10-CM

## 2023-05-17 MED ORDER — LEUPROLIDE ACETATE (6 MONTH) 45 MG ~~LOC~~ KIT
45.0000 mg | PACK | Freq: Once | SUBCUTANEOUS | Status: AC
  Administered 2023-05-17: 45 mg via SUBCUTANEOUS

## 2023-05-17 NOTE — Progress Notes (Signed)
Eligard SubQ Injection   Due to Prostate Cancer patient is present today for a Eligard Injection.  Medication: Eligard 6 month Dose: 45 mg  Location: right UOQ Lot: [id number] Exp: 10/2024  Patient tolerated well, no complications were noted  Performed by: Ayansh Feutz H RMA  Per Lyndal Rainbow, PA patient is to continue therapy for at least until 01/2024 . Patient's next follow up was scheduled for 11/10/2023. This appointment was scheduled using wheel and given to patient today along with reminder continue on Vitamin D 800-1000iu and Calcium 1000-1200mg  daily while on Androgen Deprivation Therapy.  PA approval dates:  NO PA needed.

## 2023-05-20 ENCOUNTER — Ambulatory Visit: Payer: PPO | Admitting: Radiation Oncology

## 2023-06-03 ENCOUNTER — Other Ambulatory Visit: Payer: Self-pay | Admitting: *Deleted

## 2023-06-03 ENCOUNTER — Ambulatory Visit
Admission: RE | Admit: 2023-06-03 | Discharge: 2023-06-03 | Disposition: A | Discharge status: Home / Self Care | Payer: PPO | Source: Ambulatory Visit | Attending: Radiation Oncology | Admitting: Radiation Oncology

## 2023-06-03 ENCOUNTER — Encounter: Payer: Self-pay | Admitting: Radiation Oncology

## 2023-06-03 VITALS — BP 137/71 | HR 69 | Temp 98.3°F | Resp 16 | Wt 176.0 lb

## 2023-06-03 DIAGNOSIS — Z923 Personal history of irradiation: Secondary | ICD-10-CM | POA: Insufficient documentation

## 2023-06-03 DIAGNOSIS — C61 Malignant neoplasm of prostate: Secondary | ICD-10-CM | POA: Diagnosis present

## 2023-06-03 DIAGNOSIS — R531 Weakness: Secondary | ICD-10-CM | POA: Insufficient documentation

## 2023-06-03 DIAGNOSIS — C775 Secondary and unspecified malignant neoplasm of intrapelvic lymph nodes: Secondary | ICD-10-CM | POA: Diagnosis not present

## 2023-06-03 DIAGNOSIS — R5383 Other fatigue: Secondary | ICD-10-CM | POA: Insufficient documentation

## 2023-06-03 DIAGNOSIS — R350 Frequency of micturition: Secondary | ICD-10-CM | POA: Diagnosis not present

## 2023-06-03 DIAGNOSIS — R351 Nocturia: Secondary | ICD-10-CM | POA: Insufficient documentation

## 2023-06-03 NOTE — Progress Notes (Signed)
Radiation Oncology Follow up Note  Name: Alexander Davidson   Date:   06/03/2023 MRN:  [id number] DOB: 10-08-1945    This 78 y.o. male presents to the clinic today for 42-month follow-up status post IMRT radiation therapy for Gleason 9 (4+5) adenocarcinoma the prostate presenting with a PSA of 10.6.  REFERRING PROVIDER: Marisue Ivan, MD  HPI: Patient is an 78 year old male now out 16 months having completed IMRT radiation therapy to his prostate and pelvic nodes for Gleason 9 adenocarcinoma presenting with a PSA of 10.  6.  Seen today in routine follow-up he is doing fairly well he does have some problems with urinary frequency and nocturia although continues to refuse Flomax.  His most recent PSA in December was 0.01.  He is having no lower bowel problems.  He complains about continued weakness and slight fatigue from his ADT therapy which is being provided through urology.  COMPLICATIONS OF TREATMENT: none  FOLLOW UP COMPLIANCE: keeps appointments   PHYSICAL EXAM:  BP 137/71   Pulse 69   Temp 98.3 F (36.8 C)   Resp 16   Wt 176 lb (79.8 kg)   BMI 26.76 kg/m  Well-developed well-nourished patient in NAD. HEENT reveals PERLA, EOMI, discs not visualized.  Oral cavity is clear. No oral mucosal lesions are identified. Neck is clear without evidence of cervical or supraclavicular adenopathy. Lungs are clear to A&P. Cardiac examination is essentially unremarkable with regular rate and rhythm without murmur rub or thrill. Abdomen is benign with no organomegaly or masses noted. Motor sensory and DTR levels are equal and symmetric in the upper and lower extremities. Cranial nerves II through XII are grossly intact. Proprioception is intact. No peripheral adenopathy or edema is identified. No motor or sensory levels are noted. Crude visual fields are within normal range.  RADIOLOGY RESULTS: No current films for review  PLAN: Present time patient is under excellent biochemical control of his  prostate cancer.  He continues on ADT therapy through urology.  I will see him back in 6 months with repeat PSA at that time.  Should that be normal we will turn follow-up care over to urology.  Patient comprehends my recommendations well.  I would like to take this opportunity to thank you for allowing me to participate in the care of your patient.Alexander Miller, MD

## 2023-11-10 ENCOUNTER — Ambulatory Visit: Payer: Self-pay | Admitting: Urology

## 2023-11-10 ENCOUNTER — Ambulatory Visit: Admitting: Urology

## 2023-11-10 ENCOUNTER — Encounter: Payer: Self-pay | Admitting: Urology

## 2023-11-10 VITALS — BP 107/60 | HR 97

## 2023-11-10 DIAGNOSIS — C61 Malignant neoplasm of prostate: Secondary | ICD-10-CM | POA: Diagnosis not present

## 2023-11-10 NOTE — Progress Notes (Unsigned)
 11/10/2023 5:40 PM   Alexander Davidson [birth year] [id number]  Referring provider: Alla Amis, MD [phone] [facility] [address] [facility] [place],  [place] [address]  Chief Complaint  Patient presents with   Follow-up   Urologic history: 1.  Prostate cancer Bx 09/2021, PSA 10.6, firm left prostate Volume 51 g 6/6 left cores positive Gleason 4+5/5+4 (56-100% tissue involvement) 4/6 right cores Gleason 4+5/4+4 (10-53%) No evidence of metastatic disease CT abdomen/pelvis and bone scan cT2 N0 M0 very high risk prostate cancer (NCCN) Elected IMRT + ADT (ADT started 11/12/2021; IMRT completed 01/22/2022  HPI: Alexander Davidson is a 78 y.o. male who presents for follow-up of prostate cancer  I have not seen him since fiducial marker placement 2 years ago and he has had regular follow-ups with radiation oncology Last PSA 11/03/2023 was 0.01 He has had significant hot flashes with ADT which have continued and also complains of tiredness/fatigue which is affecting his quality of life.  He desires to discontinue ADT Denies dysuria, gross hematuria No flank, abdominal or pelvic pain Does have urinary frequency and nocturia but has to declined management by radiation oncology   PMH: Past Medical History:  Diagnosis Date   Asthma    Diabetes mellitus without complication (HCC)    GERD (gastroesophageal reflux disease)    Gout    Hyperlipidemia    Hypertension     Surgical History: No past surgical history on file.  Home Medications:  Allergies as of 11/10/2023       Reactions   Other Swelling   Questionable fish allergy- caused throat swelling        Medication List        Accurate as of November 10, 2023  5:40 PM. If you have any questions, ask your nurse or doctor.          albuterol 108 (90 Base) MCG/ACT inhaler Commonly known as: VENTOLIN HFA Inhale 2 puffs into the lungs.   aspirin EC 325 MG tablet Take 325 mg by mouth daily.   Breo Ellipta 100-25  MCG/ACT Aepb Generic drug: fluticasone furoate-vilanterol 1 puff daily.   budesonide-formoterol 160-4.5 MCG/ACT inhaler Commonly known as: SYMBICORT Inhale into the lungs.   ferrous sulfate 325 (65 FE) MG EC tablet Take 325 mg by mouth daily with breakfast.   gentamicin  ointment 0.1 % Commonly known as: GARAMYCIN  Apply topically.   glucose blood test strip Use 1 each as directed. Check CBG's twice daily and PRN if symptomatic. Dx: 250.02   HumuLIN 70/30 (70-30) 100 UNIT/ML injection Generic drug: insulin NPH-regular Human 45 units twice daily   hydrochlorothiazide 25 MG tablet Commonly known as: HYDRODIURIL Take 25 mg by mouth daily.   INSULIN SYRINGE 1CC/31GX5/16 31G X 5/16 1 ML Misc Use 1 Syringe 2 (two) times daily.   losartan 100 MG tablet Commonly known as: COZAAR Take 100 mg by mouth daily.   losartan-hydrochlorothiazide 100-25 MG tablet Commonly known as: HYZAAR   metFORMIN 1000 MG tablet Commonly known as: GLUCOPHAGE Take by mouth.   Multi-Vitamins Tabs Take by mouth.   omeprazole 10 MG capsule Commonly known as: PRILOSEC Take by mouth.   omeprazole 20 MG tablet Commonly known as: PRILOSEC OTC 0.5 tablets daily.   OneTouch Delica Lancets Fine Misc Use 1 Units as directed. Check CBG's twice daily and PRN if symptomatic. Dx: 250.02   simvastatin 20 MG tablet Commonly known as: ZOCOR Take by mouth.   URINOZINC PLUS PO Take by mouth.   OSTEO BI-FLEX/5-LOXIN  ADVANCED PO Take by mouth.        Allergies:  Allergies  Allergen Reactions   Other Swelling    Questionable fish allergy- caused throat swelling    Family History: Family History  Problem Relation Age of Onset   Prostate cancer Neg Hx    Bladder Cancer Neg Hx    Kidney cancer Neg Hx     Social History:  reports that he has never smoked. He has never used smokeless tobacco. He reports current alcohol use. He reports that he does not use drugs.   Physical Exam: BP 107/60    Pulse 97   Constitutional:  Alert and oriented, No acute distress. HEENT: Gering AT Respiratory: Normal respiratory effort, no increased work of breathing. Psychiatric: Normal mood and affect.   Assessment & Plan:    1.  T2 prostate cancer (very high risk) PSA remains low and stable He has declined further ADT with leuprolide  We discussed his PSA will need to be followed closely and if it starts to rise would recommend restarting Follow-up lab visit 3 months for PSA and testosterone level.   Glendia JAYSON Barba, MD  [facility] [address], [address] [place], [place] [address] [phone]

## 2023-11-24 ENCOUNTER — Other Ambulatory Visit: Payer: PPO

## 2023-12-02 ENCOUNTER — Ambulatory Visit
Admission: RE | Admit: 2023-12-02 | Discharge: 2023-12-02 | Disposition: A | Discharge status: Home / Self Care | Payer: PPO | Source: Ambulatory Visit | Attending: Radiation Oncology | Admitting: Radiation Oncology

## 2023-12-02 ENCOUNTER — Encounter: Payer: Self-pay | Admitting: Radiation Oncology

## 2023-12-02 VITALS — BP 130/56 | HR 62 | Resp 16 | Ht 66.0 in | Wt 179.6 lb

## 2023-12-02 DIAGNOSIS — C61 Malignant neoplasm of prostate: Secondary | ICD-10-CM | POA: Insufficient documentation

## 2023-12-02 DIAGNOSIS — Z923 Personal history of irradiation: Secondary | ICD-10-CM | POA: Diagnosis not present

## 2023-12-02 NOTE — Progress Notes (Signed)
 Radiation Oncology Follow up Note  Name: Alexander Davidson   Date:   12/02/2023 MRN:  [id number] DOB: [birth year]    This 78 y.o. male presents to the clinic today for 51-month follow-up status post image guided IMRT radiation therapy for Gleason 9 (4+5) adenocarcinoma prostate presenting the PSA of 10.  REFERRING PROVIDER: Alla Amis, MD  HPI: Patient is an 78 year old male now out 22 months having completed image guided IMRT radiation therapy to his prostate and pelvic nodes for Gleason 9 adenocarcinoma presenting with a PSA of 10.6 seen today in routine follow-up he is doing well specifically denies any significant increased lower urinary tract symptoms diarrhea or fatigue.  His most recent PSA is 0.01.SABRA  Patient was recently seen by urology has declined any further ADT therapy.  His most recent PSA last month was 0.01 showing excellent biochemical control of his disease.  COMPLICATIONS OF TREATMENT: none  FOLLOW UP COMPLIANCE: keeps appointments   PHYSICAL EXAM:  BP (!) 130/56   Pulse 62   Resp 16   Ht 5' 6 (1.676 m)   Wt 179 lb 9.6 oz (81.5 kg)   BMI 28.99 kg/m  Well-developed well-nourished patient in NAD. HEENT reveals PERLA, EOMI, discs not visualized.  Oral cavity is clear. No oral mucosal lesions are identified. Neck is clear without evidence of cervical or supraclavicular adenopathy. Lungs are clear to A&P. Cardiac examination is essentially unremarkable with regular rate and rhythm without murmur rub or thrill. Abdomen is benign with no organomegaly or masses noted. Motor sensory and DTR levels are equal and symmetric in the upper and lower extremities. Cranial nerves II through XII are grossly intact. Proprioception is intact. No peripheral adenopathy or edema is identified. No motor or sensory levels are noted. Crude visual fields are within normal range.  RADIOLOGY RESULTS: No current films for review  PLAN: Present time patient is under excellent biochemical control  of his prostate cancer.  He has declined further ADT treatment.  He is continue follow-up with urology.  At this time I will turn follow-up care over to urology since almost 2 years out.  I be happy to reevaluate the patient anytime should that be indicated.  Patient knows to call with any concerns at any time.  I would like to take this opportunity to thank you for allowing me to participate in the care of your patient.SABRA Alexander Penton, MD

## 2024-02-07 ENCOUNTER — Other Ambulatory Visit: Payer: Self-pay | Admitting: *Deleted

## 2024-02-07 DIAGNOSIS — R7989 Other specified abnormal findings of blood chemistry: Secondary | ICD-10-CM

## 2024-02-07 DIAGNOSIS — R3989 Other symptoms and signs involving the genitourinary system: Secondary | ICD-10-CM

## 2024-02-07 DIAGNOSIS — C61 Malignant neoplasm of prostate: Secondary | ICD-10-CM

## 2024-02-09 ENCOUNTER — Other Ambulatory Visit

## 2024-02-09 DIAGNOSIS — C61 Malignant neoplasm of prostate: Secondary | ICD-10-CM

## 2024-02-09 DIAGNOSIS — R7989 Other specified abnormal findings of blood chemistry: Secondary | ICD-10-CM

## 2024-02-10 ENCOUNTER — Ambulatory Visit: Payer: Self-pay | Admitting: Urology

## 2024-02-10 LAB — PSA: Prostate Specific Ag, Serum: <0.1 ng/mL (ref 0.0–4.0)

## 2024-02-10 LAB — TESTOSTERONE: Testosterone: <3 ng/dL — ABNORMAL LOW (ref 264–916)

## 2024-05-19 IMAGING — CT CT ABD-PELV W/ CM
2 of 5 series · 15 of 46 positions shown, 17 images · IV contrast (APPLIED)
Comparison: None Available.

CLINICAL DATA: New diagnosis of prostate cancer.

EXAM:
CT ABDOMEN AND PELVIS WITH CONTRAST
TECHNIQUE: Multidetector CT imaging of the abdomen and pelvis was performed
using the standard protocol following bolus administration of
intravenous contrast.

[Series 2: routine abd/pel with · axial · 0.79mm/px · z∈[-655,-165]mm · 12 of 110 slices shown, 14 images]
[im 6/110  soft-tissue]
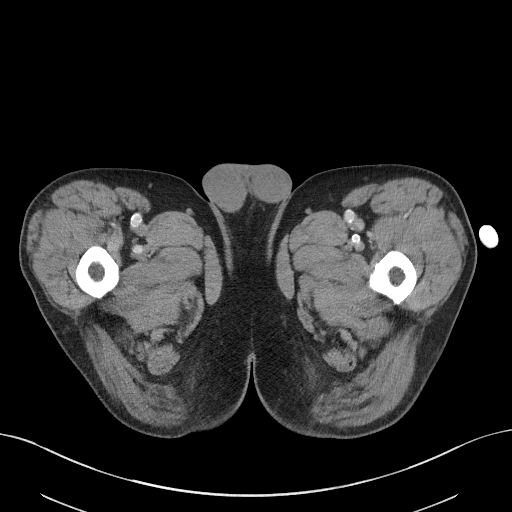
[im 6/110  bone]
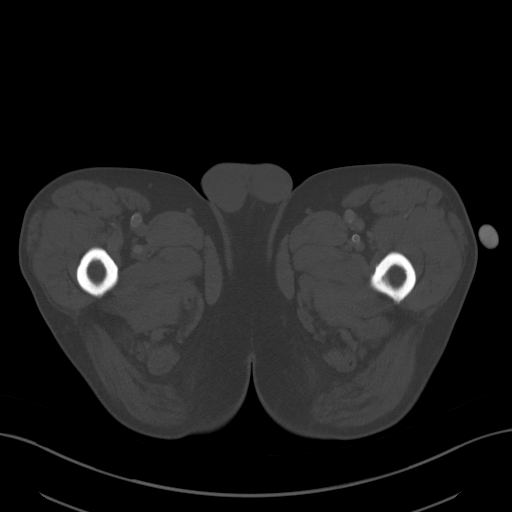
[im 17/110  soft-tissue]
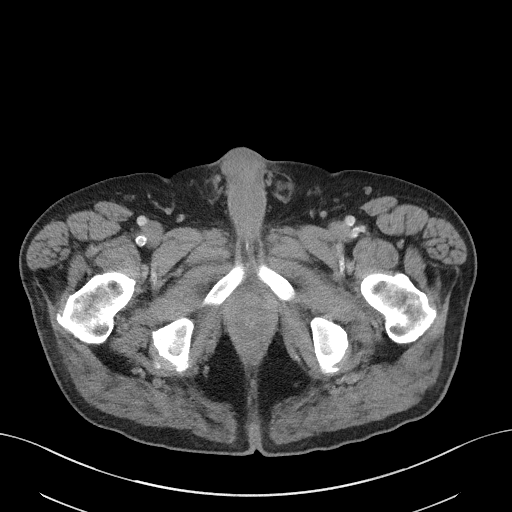
[im 22/110  soft-tissue]
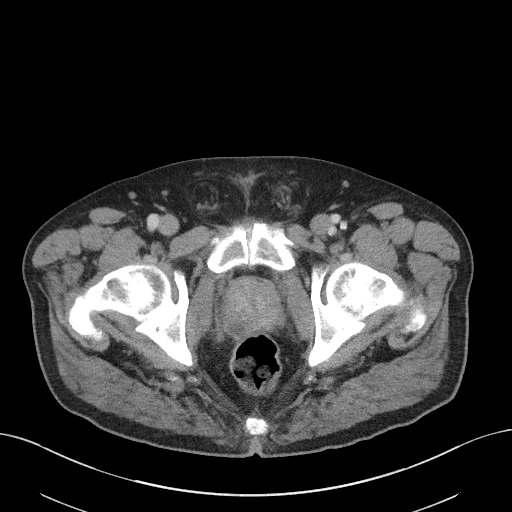
[im 33/110  soft-tissue]
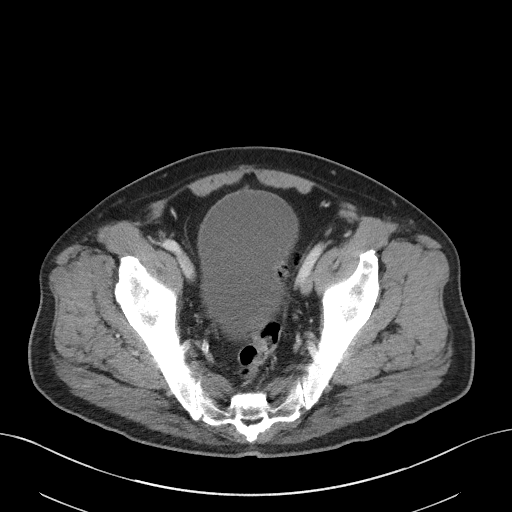
[im 44/110  soft-tissue]
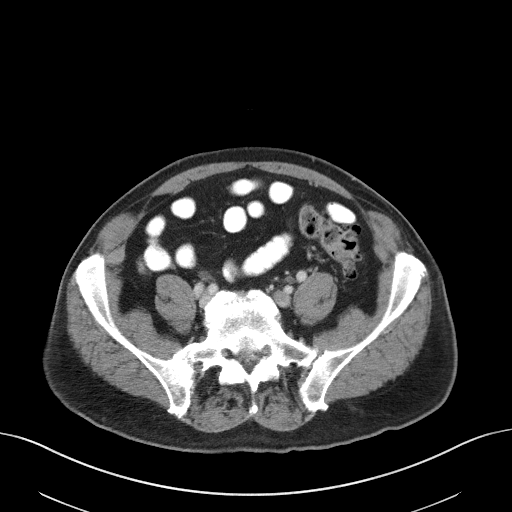
[im 50/110  soft-tissue]
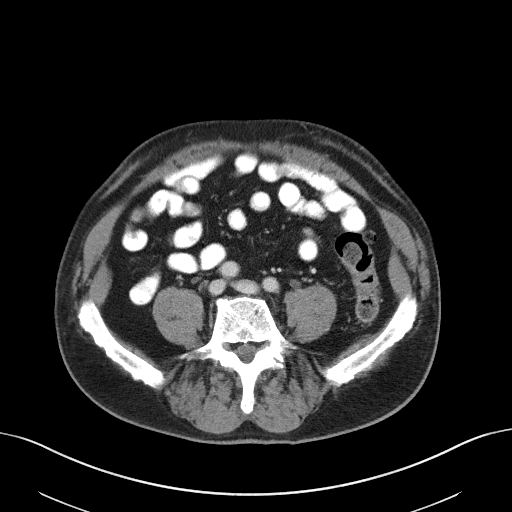
[im 60/110  soft-tissue]
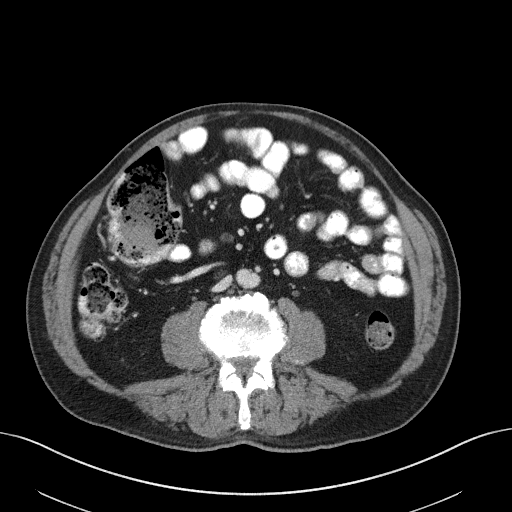
[im 66/110  soft-tissue]
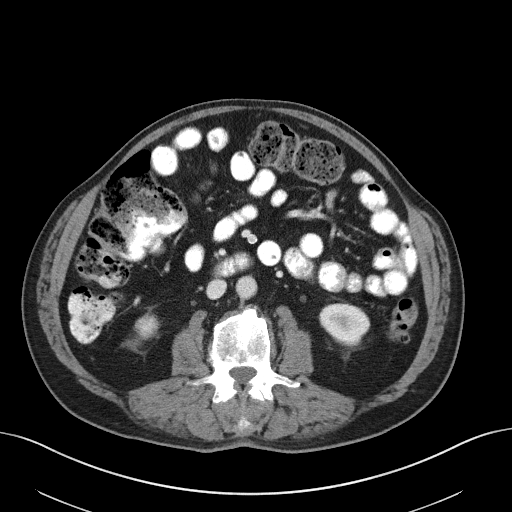
[im 77/110  soft-tissue]
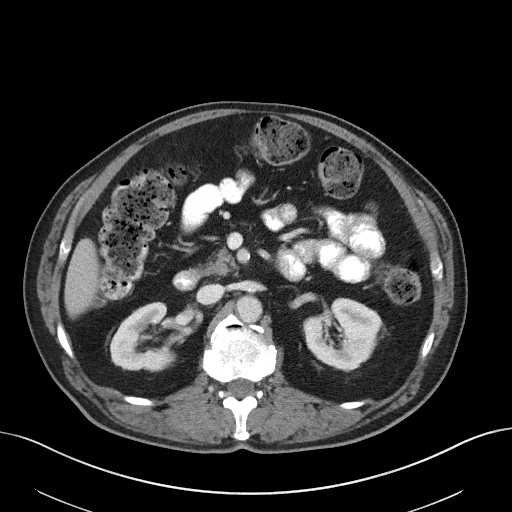
[im 77/110  bone]
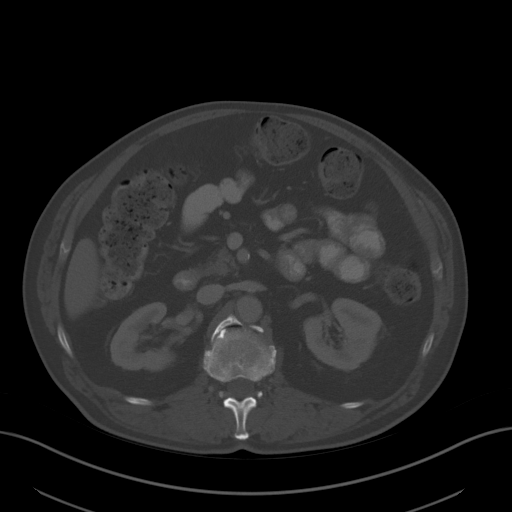
[im 88/110  soft-tissue]
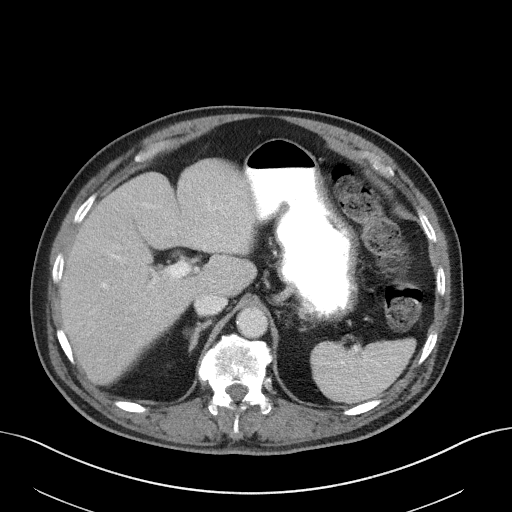
[im 93/110  soft-tissue]
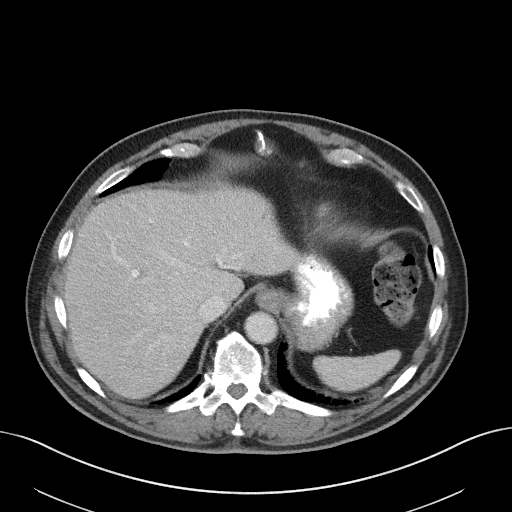
[im 104/110  soft-tissue]
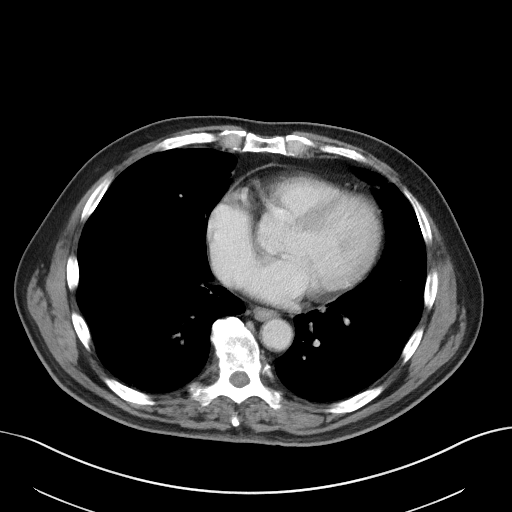

[Series 5: coronal st · coronal · 0.77mm/px · 3 of 95 slices shown]
[im 32/95  soft-tissue]
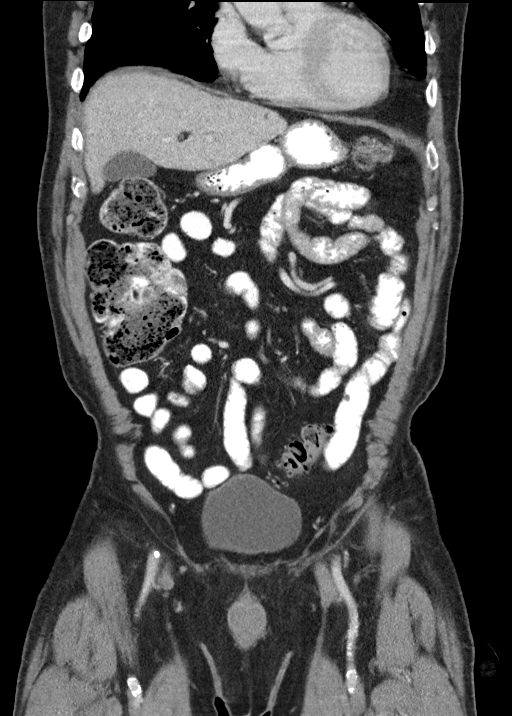
[im 42/95  soft-tissue]
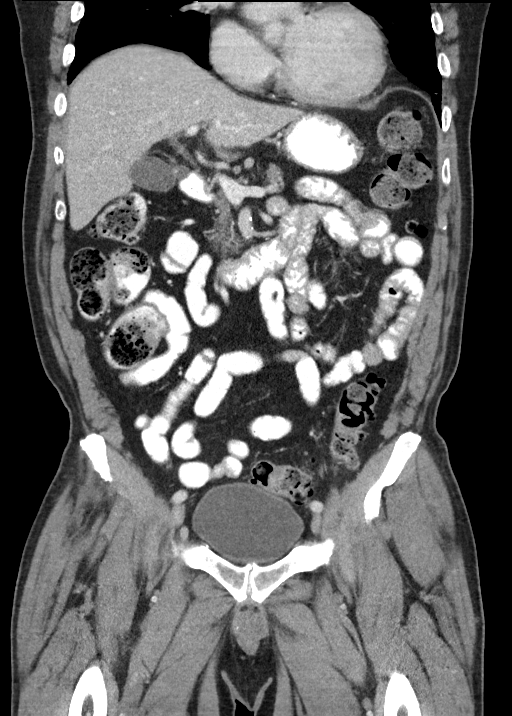
[im 53/95  soft-tissue]
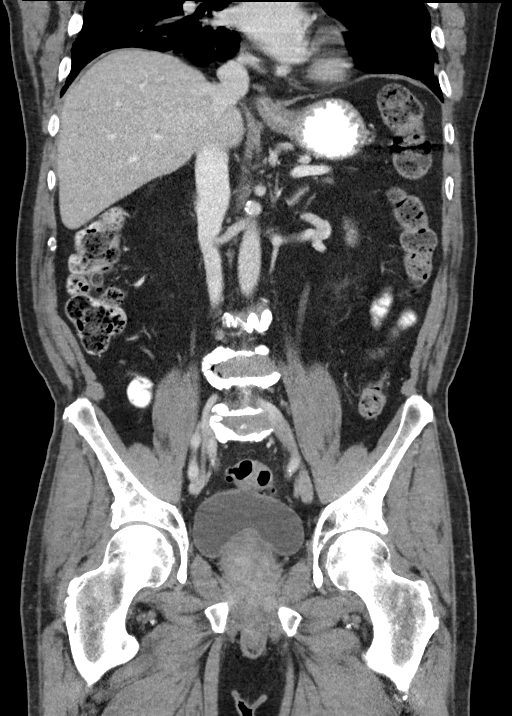

[15 of 46 positions shown; findings below may reference images not displayed]

RADIATION DOSE REDUCTION: This exam was performed according to the
departmental dose-optimization program which includes automated
exposure control, adjustment of the mA and/or kV according to
patient size and/or use of iterative reconstruction technique.

CONTRAST:  80mL OMNIPAQUE IOHEXOL 300 MG/ML  SOLN
FINDINGS: Lower chest: Basilar scarring changes but no infiltrates or
effusions. No pulmonary lesions. The heart is normal in size. Aortic
and coronary artery calcifications noted.

Hepatobiliary: No hepatic lesions or intrahepatic biliary
dilatation. Layering gallstones are noted in the gallbladder. No
common bile duct dilatation.

Pancreas: No mass, inflammation or ductal dilatation. Moderate
pancreatic atrophy.

Spleen: Normal size.  No focal lesions.

Adrenals/Urinary Tract: The adrenal glands are normal.

No renal lesions or hydronephrosis.  The bladder is unremarkable.

Stomach/Bowel: The stomach, duodenum, small bowel and colon are
unremarkable. No acute inflammatory changes, mass lesions or
obstructive findings. Significant sigmoid colon diverticulosis but
no findings for acute diverticulitis.

Vascular/Lymphatic: Mild scattered vascular calcifications but no
aneurysm or dissection. The major venous structures are patent. No
mesenteric or retroperitoneal mass or adenopathy.

Reproductive: The prostate gland is minimally enlarged. There is
moderate median lobe hypertrophy impressing on the base of the
bladder. The seminal vesicles are unremarkable. No pelvic or
inguinal lymphadenopathy.

Other: No pelvic mass or adenopathy. No free pelvic fluid
collections. No inguinal mass or adenopathy. No abdominal wall
hernia or subcutaneous lesions.

Musculoskeletal: No significant bony findings. No worrisome bone
lesions to suggest metastatic disease.
IMPRESSION: 1. No acute abdominal/pelvic findings, mass lesions or
lymphadenopathy.
2. Mild prostate gland enlargement.  No pelvic adenopathy.
3. No worrisome bone lesions to suggest metastatic disease.
4. Cholelithiasis.
5. Sigmoid colon diverticulosis without findings for acute
diverticulitis.

Aortic Atherosclerosis (CPNUW-2H7.7).

## 2024-05-26 IMAGING — NM NM BONE WHOLE BODY
2 series · 10 of 10 positions shown · non-contrast
Comparison: CT abdomen pelvis 10/15/2021, cervical spine CT
12/12/2012.

CLINICAL DATA: Prostate cancer

EXAM:
NUCLEAR MEDICINE WHOLE BODY BONE SCAN
TECHNIQUE: Whole body anterior and posterior images were obtained approximately
3 hours after intravenous injection of radiopharmaceutical.
RADIOPHARMACEUTICALS:  20.42 mCi Oechnetium-JJm MDP IV

[Series 1000: statics · 2.40mm/px · 4 acquisitions, 8 frames shown]
[im 1/4]
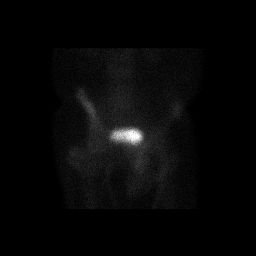
[im 1/4]
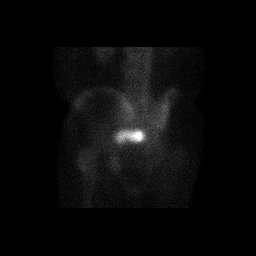
[im 2/4]
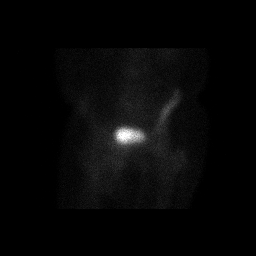
[im 2/4]
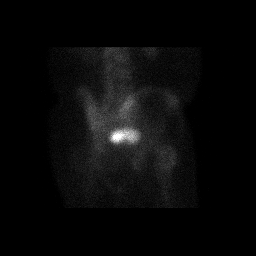
[im 3/4]
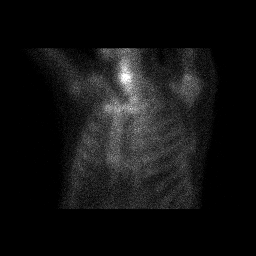
[im 3/4]
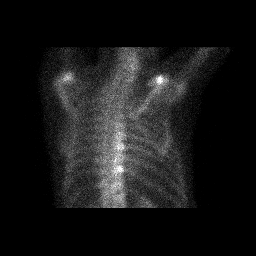
[im 4/4]
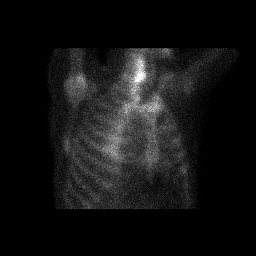
[im 4/4]
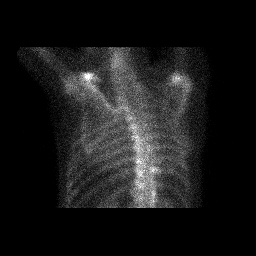

[Series 1000: 3 hr wholebody · 2.40mm/px · 2 of 2 frames shown]
[frame 1/2]
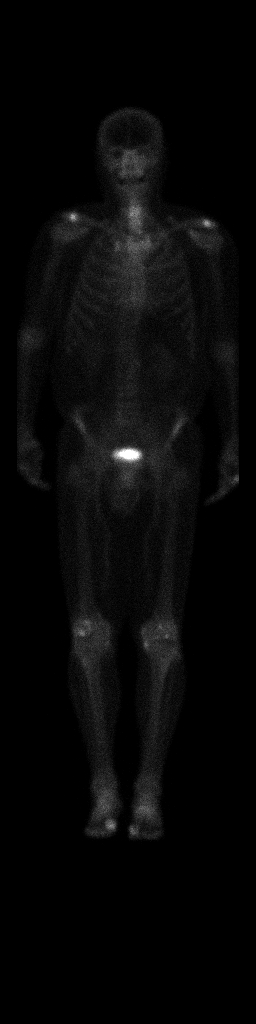
[frame 2/2]
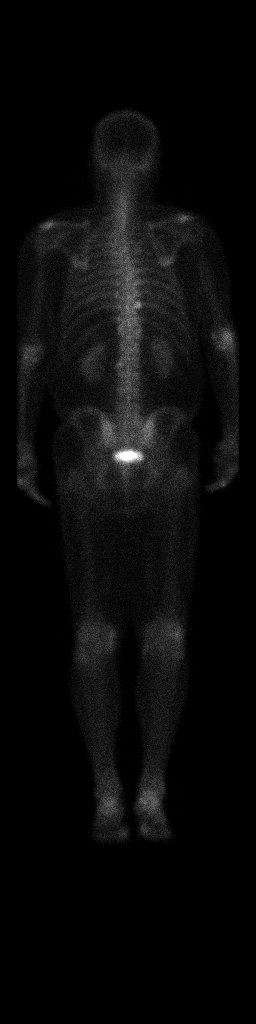

[10 of 10 positions shown; findings below may reference images not displayed]

FINDINGS: Physiologic distribution of radiotracer with bilateral renal uptake
and excretion. Focal radiotracer uptake is is seen at the level of
the right T10 costovertebral junction. Asymmetric degenerative
changes at this location are present on recent CT. Relatively
increased uptake is also seen in the region of the mid to lower
cervical spine anteriorly. Bulky osteophyte formation seen on
previous cervical spine CT at this location. Additional sites of
relatively increased uptake in the region of the bilateral AC
joints, bilateral sternoclavicular joints, bilateral knees, and feet
including the first MTP joints bilaterally.
IMPRESSION: 1. No convincing scintigraphic evidence of osseous metastatic
disease.
2. Scattered areas of focal uptake are favored degenerative with
corresponding findings on prior cross-sectional imaging.

## 2024-08-10 ENCOUNTER — Other Ambulatory Visit

## 2024-08-11 ENCOUNTER — Ambulatory Visit: Admitting: Urology
